# Patient Record
Sex: Male | Born: 1985 | Race: Black or African American | Hispanic: No | Marital: Married | Smoking: Never smoker
Health system: Southern US, Community
[De-identification: ages and names within clinical notes are randomized; demographics above are authoritative.]

## PROBLEM LIST (undated history)

## (undated) ENCOUNTER — Emergency Department: Payer: Self-pay

## (undated) DIAGNOSIS — R42 Dizziness and giddiness: Secondary | ICD-10-CM

## (undated) DIAGNOSIS — R001 Bradycardia, unspecified: Secondary | ICD-10-CM

## (undated) DIAGNOSIS — R7989 Other specified abnormal findings of blood chemistry: Secondary | ICD-10-CM

## (undated) DIAGNOSIS — R778 Other specified abnormalities of plasma proteins: Secondary | ICD-10-CM

## (undated) DIAGNOSIS — U071 COVID-19: Secondary | ICD-10-CM

## (undated) DIAGNOSIS — R9431 Abnormal electrocardiogram [ECG] [EKG]: Secondary | ICD-10-CM

## (undated) HISTORY — DX: Bradycardia, unspecified: R00.1

## (undated) HISTORY — DX: COVID-19: U07.1

## (undated) HISTORY — DX: Other specified abnormalities of plasma proteins: R77.8

## (undated) HISTORY — DX: Dizziness and giddiness: R42

## (undated) HISTORY — DX: Abnormal electrocardiogram (ECG) (EKG): R94.31

## (undated) HISTORY — DX: Other specified abnormal findings of blood chemistry: R79.89

---

## 2016-05-05 ENCOUNTER — Emergency Department (HOSPITAL_BASED_OUTPATIENT_CLINIC_OR_DEPARTMENT_OTHER)
Admission: EM | Admit: 2016-05-05 | Discharge: 2016-05-05 | Disposition: A | Discharge status: Home / Self Care | Payer: Self-pay | Attending: Emergency Medicine | Admitting: Emergency Medicine

## 2016-05-05 ENCOUNTER — Encounter (HOSPITAL_BASED_OUTPATIENT_CLINIC_OR_DEPARTMENT_OTHER): Payer: Self-pay | Admitting: Emergency Medicine

## 2016-05-05 DIAGNOSIS — R599 Enlarged lymph nodes, unspecified: Secondary | ICD-10-CM | POA: Insufficient documentation

## 2016-05-05 MED ORDER — IBUPROFEN 800 MG PO TABS: 800.0000 mg | ORAL_TABLET | Freq: Three times a day (TID) | ORAL | 0 refills | Status: DC | PRN

## 2016-05-05 NOTE — ED Notes (Addendum)
Has a chip wisdom tooth on rt side x several weeks,  States now is having temporal swelling and ha   Pt playing on cell phone

## 2016-05-05 NOTE — Discharge Instructions (Signed)
Return here as needed.  Use warm compresses over the areas.  Follow-up with the ENT, Dr. provided

## 2016-05-05 NOTE — ED Provider Notes (Signed)
[facility] Provider Note   CSN: [id number] Arrival date & time: 05/05/16  2010  By signing my name below, I, [name], attest that this documentation has been prepared under the direction and in the presence of BoeingChris Hriday Stai, VF CorporationPA-C. Electronically Signed: Angelene GiovanniEmmanuella Mensah, ED Scribe. 05/05/16. 9:07 PM.   History   Chief Complaint Chief Complaint  Patient presents with  . Headache    HPI Comments: Jason Moreno is a 30 y.o. male who presents to the Emergency Department complaining of gradually worsening moderate temporal headache onset one week ago. He explains that one month ago he had right lower dental pain that resolved on its own and now he is experiencing swelling to his bilateral temples. He adds that he has not been sleeping appropriately. He denies any recent falls, injuries, or trauma. No alleviating factors noted. Pt has not tried any medications PTA. He has NKDA. He denies any fever, chills, visual changes, nausea, vomiting, shortness of breath, generalized rash, or any other symptoms.   The history is provided by the patient. No language interpreter was used.    History reviewed. No pertinent past medical history.  There are no active problems to display for this patient.   History reviewed. No pertinent surgical history.     Home Medications    Prior to Admission medications   Medication Sig Start Date End Date Taking? Authorizing Provider  ibuprofen (ADVIL,MOTRIN) 600 MG tablet Take 600 mg by mouth every 6 (six) hours as needed.   Yes Historical Provider, MD    Family History History reviewed. No pertinent family history.  Social History Social History  Substance Use Topics  . Smoking status: Never Smoker  . Smokeless tobacco: Never Used  . Alcohol use Yes     Comment: occ     Allergies   Patient has no known allergies.   Review of Systems Review of Systems All other systems negative except as documented in the HPI. All  pertinent positives and negatives as reviewed in the HPI.  Physical Exam Updated Vital Signs BP 158/90 (BP Location: Left Arm)   Pulse (!) 50   Temp 98.7 F (37.1 C) (Oral)   Resp 18   Ht 5\' 8"  (1.727 m)   Wt 177 lb (80.3 kg)   SpO2 99%   BMI 26.91 kg/m   Physical Exam  Constitutional: He is oriented to person, place, and time. He appears well-developed and well-nourished.  HENT:  Head: Normocephalic and atraumatic.  Swelling to his temple region, right greater than left; no fluctuance   Pulmonary/Chest: Effort normal.  Neurological: He is alert and oriented to person, place, and time.  Skin: Skin is warm and dry.  Psychiatric: He has a normal mood and affect.  Nursing note and vitals reviewed.    ED Treatments / Results  DIAGNOSTIC STUDIES: Oxygen Saturation is 99% on RA, normal by my interpretation.    COORDINATION OF CARE: 9:03 PM- Pt advised of plan for treatment and pt agrees. Will consult attending for further plan for treatment.    Labs (all labs ordered are listed, but only abnormal results are displayed) Labs Reviewed - No data to display  EKG  EKG Interpretation None       Radiology No results found.  Procedures Procedures (including critical care time)  Medications Ordered in ED Medications - No data to display   Initial Impression / Assessment and Plan / ED Course  Ebbie Ridgehris Adriana Quinby, PA-C has reviewed the triage vital signs and the  nursing notes.  Pertinent labs & imaging results that were available during my care of the patient were reviewed by me and considered in my medical decision making (see chart for details).  Clinical Course     Patient be referred to ENT for further evaluation of the area.  This could be a lymph node involvement.  There is no signs of infection or abscess  Final Clinical Impressions(s) / ED Diagnoses   Final diagnoses:  None    New Prescriptions New Prescriptions   No medications on file   I personally  performed the services described in this documentation, which was scribed in my presence. The recorded information has been reviewed and is accurate.   Charlestine NightChristopher Vaibhav Fogleman, PA-C 05/07/16 0131    Maia PlanJoshua G Long, MD 05/07/16 [phone]

## 2016-05-05 NOTE — ED Triage Notes (Signed)
Patient c/o headache x1 week after having ongoing dental pain associated with his wisdom teeth. Patient states today he has noticed swelling at his temples.

## 2017-01-30 ENCOUNTER — Emergency Department: Payer: Self-pay

## 2017-01-30 ENCOUNTER — Emergency Department
Admission: EM | Admit: 2017-01-30 | Discharge: 2017-01-30 | Disposition: A | Discharge status: Home / Self Care | Payer: Self-pay | Attending: Emergency Medicine | Admitting: Emergency Medicine

## 2017-01-30 DIAGNOSIS — K047 Periapical abscess without sinus: Secondary | ICD-10-CM | POA: Insufficient documentation

## 2017-01-30 LAB — CBC WITH DIFFERENTIAL/PLATELET
BASOS ABS: 0.1 10*3/uL (ref 0–0.1)
BASOS PCT: 1 %
EOS PCT: 1 %
Eosinophils Absolute: 0.1 10*3/uL (ref 0–0.7)
HEMATOCRIT: 43.5 % (ref 40.0–52.0)
Hemoglobin: 14.8 g/dL (ref 13.0–18.0)
LYMPHS ABS: 1.7 10*3/uL (ref 1.0–3.6)
Lymphocytes Relative: 19 %
MCH: 32.2 pg (ref 26.0–34.0)
MCHC: 34 g/dL (ref 32.0–36.0)
MCV: 94.7 fL (ref 80.0–100.0)
Monocytes Absolute: 0.7 10*3/uL (ref 0.2–1.0)
Monocytes Relative: 8 %
Neutro Abs: 6.3 10*3/uL (ref 1.4–6.5)
Neutrophils Relative %: 71 %
PLATELETS: 223 10*3/uL (ref 150–440)
RBC: 4.59 MIL/uL (ref 4.40–5.90)
RDW: 13.7 % (ref 11.5–14.5)
WBC: 8.9 10*3/uL (ref 3.8–10.6)

## 2017-01-30 LAB — BASIC METABOLIC PANEL
Anion gap: 7 (ref 5–15)
BUN: 7 mg/dL (ref 6–20)
CHLORIDE: 103 mmol/L (ref 101–111)
CO2: 27 mmol/L (ref 22–32)
CREATININE: 1.01 mg/dL (ref 0.61–1.24)
Calcium: 9.7 mg/dL (ref 8.9–10.3)
Glucose, Bld: 105 mg/dL — ABNORMAL HIGH (ref 65–99)
POTASSIUM: 4.1 mmol/L (ref 3.5–5.1)
SODIUM: 137 mmol/L (ref 135–145)

## 2017-01-30 MED ORDER — IOPAMIDOL (ISOVUE-300) INJECTION 61%
75.0000 mL | Freq: Once | INTRAVENOUS | Status: AC | PRN
  Administered 2017-01-30: 75 mL via INTRAVENOUS

## 2017-01-30 MED ORDER — IBUPROFEN 600 MG PO TABS: 600.0000 mg | ORAL_TABLET | Freq: Four times a day (QID) | ORAL | 0 refills | Status: DC | PRN

## 2017-01-30 MED ORDER — CLINDAMYCIN HCL 300 MG PO CAPS: 300.0000 mg | ORAL_CAPSULE | Freq: Three times a day (TID) | ORAL | 0 refills | Status: AC

## 2017-01-30 MED ORDER — IBUPROFEN 600 MG PO TABS
600.0000 mg | ORAL_TABLET | Freq: Once | ORAL | Status: AC
  Administered 2017-01-30: 600 mg via ORAL
  Filled 2017-01-30: qty 1

## 2017-01-30 NOTE — ED Provider Notes (Signed)
[facility] Emergency Department Provider Note ____________________________________________   First MD Initiated Contact with Patient 01/30/17 1205     (approximate)  I have reviewed the triage vital signs and the nursing notes.   HISTORY  Chief Complaint Facial Pain    HPI Jason Moreno is a 31 y.o. male no significant past medical history who presents with right-sided facial swelling for 5 days gradual onset, worsening course, associated with pain especially when he walks.Patient denies trauma to the area. He states he was seen in urgent care 3 days ago and started on amoxicillin but the swelling has worsened. Patient denies neck pain or swelling, difficulty swallowing, or shortness of breath. He reports mild headache. No dental pain. No ear pain.  History reviewed. No pertinent past medical history.  There are no active problems to display for this patient.   History reviewed. No pertinent surgical history.  Prior to Admission medications   Medication Sig Start Date End Date Taking? Authorizing Provider  amoxicillin-clavulanate (AUGMENTIN) 875-125 MG tablet Take 1 tablet by mouth 2 (two) times daily.   Yes [provider]  ibuprofen (ADVIL,MOTRIN) 800 MG tablet Take 1 tablet (800 mg total) by mouth every 8 (eight) hours as needed. 05/05/16  Yes Lawyer, Cristal Deer, PA-C  clindamycin (CLEOCIN) 300 MG capsule Take 1 capsule (300 mg total) by mouth 3 (three) times daily. 01/30/17 02/09/17  Dionne Bucy, MD  ibuprofen (ADVIL,MOTRIN) 600 MG tablet Take 1 tablet (600 mg total) by mouth every 6 (six) hours as needed (pain). 01/30/17   Dionne Bucy, MD    Allergies Patient has no known allergies.  No family history on file.  Social History Social History  Substance Use Topics  . Smoking status: Never Smoker  . Smokeless tobacco: Never Used  . Alcohol use Yes     Comment: occ    Review of Systems  Constitutional: No  fever/chills Eyes: No visual changes. ENT: No sore throat. No difficulty swallowing. Cardiovascular: Denies chest pain. Respiratory: Denies shortness of breath. Gastrointestinal: No nausea, no vomiting.   Genitourinary: Negative for flank pain.  Musculoskeletal: Negative for back pain. Skin: Negative for rash. Neurological: Positive for headaches, negative for focal weakness or numbness.   ____________________________________________   PHYSICAL EXAM:  VITAL SIGNS: ED Triage Vitals  Enc Vitals Group     BP 01/30/17 1113 (!) 199/106     Pulse Rate 01/30/17 1113 (!) 59     Resp 01/30/17 1113 16     Temp 01/30/17 1113 98.8 F (37.1 C)     Temp Source 01/30/17 1113 Oral     SpO2 01/30/17 1113 99 %     Weight 01/30/17 1114 180 lb (81.6 kg)     Height --      Head Circumference --      Peak Flow --      Pain Score 01/30/17 1112 8     Pain Loc --      Pain Edu? --      Excl. in GC? --     Constitutional: Alert and oriented. Well appearing and in no acute distress. Eyes: Conjunctivae are normal.  Head: Atraumatic. Right maxillary area with swelling, with mild fluctuance. Nose: No congestion/rhinnorhea. Mouth/Throat: Mucous membranes are moist.  Oropharynx clear, no pooling secretions. No stridor. Neck: Normal range of motion. Right anterior cervical mild lymphadenopathy. Cardiovascular: Normal rate, regular rhythm. Good peripheral circulation. Respiratory: Normal respiratory effort.  No retractions. Gastrointestinal:  No distention.  Genitourinary: No CVA tenderness. Musculoskeletal:  Extremities warm and well perfused.  Neurologic:  Normal speech and language. No gross focal neurologic deficits are appreciated.  Skin:  Skin is warm and dry. No rash noted. Psychiatric: Mood and affect are normal. Speech and behavior are normal.  ____________________________________________   LABS (all labs ordered are listed, but only abnormal results are displayed)  Labs Reviewed    BASIC METABOLIC PANEL - Abnormal; Notable for the following:       Result Value   Glucose, Bld 105 (*)    All other components within normal limits  CBC WITH DIFFERENTIAL/PLATELET   ____________________________________________  EKG   ____________________________________________  RADIOLOGY    ____________________________________________   PROCEDURES  Procedure(s) performed: No    Critical Care performed: No ____________________________________________   INITIAL IMPRESSION / ASSESSMENT AND PLAN / ED COURSE  Pertinent labs & imaging results that were available during my care of the patient were reviewed by me and considered in my medical decision making (see chart for details).  31 year old malesignificant past medical history presents with approximate 5 days right-sided facial swelling with pain. Vital signs normal except for hypertension likely associated with the pain, exam otherwise as described with right-sided maxillary area swelling. Differential includes dental infection, dental abscess, other soft tissue infection of buccal area, less likely parotitis based on location. Plan for a sick labs and CT maxillofacial to evaluate for abscess and possible origin.    ----------------------------------------- 5:17 PM on 01/30/2017 -----------------------------------------  CT revealed evidence of periapical infection with some involvement of maxillary sinus. I consulted Dr. Mauri Pole from [facility] manifests at [facility] and read the CT results to him over the phone and described patient's clinical status. He recommended patient get close dental follow-up likely for root canal or extraction. Based on clinical status no indication for IV antibiotics, emergent procedure, or admission. Will change antibiotic to broader coverage with clindamycin. Patient feels well to go home, and I gave him extensive return precautions. Will return if worsening swelling, pain, fever, weakness or any difficulty  breathing or swallowing.  ____________________________________________   FINAL CLINICAL IMPRESSION(S) / ED DIAGNOSES  Final diagnoses:  Dental infection      NEW MEDICATIONS STARTED DURING THIS VISIT:  Discharge Medication List as of 01/30/2017  5:26 PM    START taking these medications   Details  clindamycin (CLEOCIN) 300 MG capsule Take 1 capsule (300 mg total) by mouth 3 (three) times daily., Starting Sat 01/30/2017, Until Tue 02/09/2017, Print    !! ibuprofen (ADVIL,MOTRIN) 600 MG tablet Take 1 tablet (600 mg total) by mouth every 6 (six) hours as needed (pain)., Starting Sat 01/30/2017, Print     !! - Potential duplicate medications found. Please discuss with provider.       Note:  This document was prepared using Dragon voice recognition software and may include unintentional dictation errors.    Dionne Bucy, MD 01/30/17 2136

## 2017-01-30 NOTE — Discharge Instructions (Signed)
Discontinue the antibiotic you're currently on and begin the clindamycin that is being prescribed.  Return to the ER for new or worsening pain worsening swelling, fevers, weakness, or any difficulty breathing or swallowing. Follow up with a dentist on Monday or Tuesday, either your own or with the resources provided here.

## 2017-01-30 NOTE — ED Triage Notes (Signed)
Pt arrives to ER via POV c/o right sided facial swelling and pain. Pt reports pain and swelling since Tuesday, was seen at an Urgent Care then and prescribed antibiotics and has been taking as prescribed. Pain and swelling worsening. Pt has clear airway, speech clear, controlling secretions.

## 2020-07-05 ENCOUNTER — Emergency Department (HOSPITAL_COMMUNITY): Payer: 59

## 2020-07-05 ENCOUNTER — Observation Stay (HOSPITAL_COMMUNITY)
Admission: EM | Admit: 2020-07-05 | Discharge: 2020-07-07 | Disposition: A | Discharge status: Home / Self Care | Payer: 59 | Attending: Internal Medicine | Admitting: Internal Medicine

## 2020-07-05 ENCOUNTER — Other Ambulatory Visit: Payer: Self-pay

## 2020-07-05 DIAGNOSIS — R9431 Abnormal electrocardiogram [ECG] [EKG]: Secondary | ICD-10-CM

## 2020-07-05 DIAGNOSIS — R7989 Other specified abnormal findings of blood chemistry: Secondary | ICD-10-CM

## 2020-07-05 DIAGNOSIS — R42 Dizziness and giddiness: Secondary | ICD-10-CM | POA: Diagnosis present

## 2020-07-05 DIAGNOSIS — U071 COVID-19: Secondary | ICD-10-CM

## 2020-07-05 DIAGNOSIS — R001 Bradycardia, unspecified: Secondary | ICD-10-CM | POA: Diagnosis present

## 2020-07-05 DIAGNOSIS — R778 Other specified abnormalities of plasma proteins: Secondary | ICD-10-CM

## 2020-07-05 DIAGNOSIS — Z7982 Long term (current) use of aspirin: Secondary | ICD-10-CM | POA: Insufficient documentation

## 2020-07-05 DIAGNOSIS — H5500 Unspecified nystagmus: Secondary | ICD-10-CM | POA: Diagnosis not present

## 2020-07-05 LAB — COMPREHENSIVE METABOLIC PANEL
ALT: 38 U/L (ref 0–44)
AST: 30 U/L (ref 15–41)
Albumin: 4.3 g/dL (ref 3.5–5.0)
Alkaline Phosphatase: 40 U/L (ref 38–126)
Anion gap: 13 (ref 5–15)
BUN: 9 mg/dL (ref 6–20)
CO2: 22 mmol/L (ref 22–32)
Calcium: 9.1 mg/dL (ref 8.9–10.3)
Chloride: 106 mmol/L (ref 98–111)
Creatinine, Ser: 1.11 mg/dL (ref 0.61–1.24)
GFR, Estimated: >60 mL/min (ref >60)
Glucose, Bld: 124 mg/dL — ABNORMAL HIGH (ref 70–99)
Potassium: 3.9 mmol/L (ref 3.5–5.1)
Sodium: 141 mmol/L (ref 135–145)
Total Bilirubin: 0.7 mg/dL (ref 0.3–1.2)
Total Protein: 7.5 g/dL (ref 6.5–8.1)

## 2020-07-05 LAB — CBC
HCT: 46.9 % (ref 39.0–52.0)
Hemoglobin: 15.4 g/dL (ref 13.0–17.0)
MCH: 31 pg (ref 26.0–34.0)
MCHC: 32.8 g/dL (ref 30.0–36.0)
MCV: 94.6 fL (ref 80.0–100.0)
Platelets: 211 10*3/uL (ref 150–400)
RBC: 4.96 MIL/uL (ref 4.22–5.81)
RDW: 13 % (ref 11.5–15.5)
WBC: 4.1 10*3/uL (ref 4.0–10.5)
nRBC: 0 % (ref 0.0–0.2)

## 2020-07-05 LAB — PROTIME-INR
INR: 1 (ref 0.8–1.2)
Prothrombin Time: 12.9 seconds (ref 11.4–15.2)

## 2020-07-05 LAB — DIFFERENTIAL
Abs Immature Granulocytes: 0.01 10*3/uL (ref 0.00–0.07)
Basophils Absolute: 0 10*3/uL (ref 0.0–0.1)
Basophils Relative: 0 %
Eosinophils Absolute: 0 10*3/uL (ref 0.0–0.5)
Eosinophils Relative: 0 %
Immature Granulocytes: 0 %
Lymphocytes Relative: 25 %
Lymphs Abs: 1.1 10*3/uL (ref 0.7–4.0)
Monocytes Absolute: 0.3 10*3/uL (ref 0.1–1.0)
Monocytes Relative: 6 %
Neutro Abs: 2.8 10*3/uL (ref 1.7–7.7)
Neutrophils Relative %: 69 %

## 2020-07-05 LAB — TROPONIN I (HIGH SENSITIVITY)
Troponin I (High Sensitivity): 37 ng/L — ABNORMAL HIGH (ref <18)
Troponin I (High Sensitivity): 35 ng/L — ABNORMAL HIGH (ref <18)

## 2020-07-05 LAB — APTT: aPTT: 28 seconds (ref 24–36)

## 2020-07-05 MED ORDER — IOHEXOL 350 MG/ML SOLN
75.0000 mL | Freq: Once | INTRAVENOUS | Status: AC | PRN
  Administered 2020-07-05: 75 mL via INTRAVENOUS

## 2020-07-05 MED ORDER — LORAZEPAM 2 MG/ML IJ SOLN
1.0000 mg | Freq: Once | INTRAMUSCULAR | Status: AC | PRN
  Administered 2020-07-05: 1 mg via INTRAVENOUS
  Filled 2020-07-05: qty 1

## 2020-07-05 MED ORDER — ONDANSETRON HCL 4 MG/2ML IJ SOLN
4.0000 mg | Freq: Once | INTRAMUSCULAR | Status: AC
  Administered 2020-07-05: 4 mg via INTRAVENOUS
  Filled 2020-07-05: qty 2

## 2020-07-05 NOTE — ED Notes (Signed)
Pt to CT

## 2020-07-05 NOTE — ED Provider Notes (Signed)
[facility] EMERGENCY DEPARTMENT Provider Note   CSN: [id number] Arrival date & time: 07/05/20  1608     History Chief Complaint  Patient presents with   Abnormal ECG    Geovany Trudo is a 35 y.o. male.  The history is provided by the patient, medical records and the EMS personnel.   Dathan Attia is a 35 y.o. male who presents to the Emergency Department complaining of dizziness and vomiting. He presents the emergency department by EMS from urgent care for evaluation of dizziness and vomiting that started yesterday. He states that symptoms began abruptly at noon when he suddenly felt dizzy and off balance when he tried to walk. He had associated nausea. Symptoms worsened at three when he went to pick up his daughter from daycare. He denies any fevers, headache, chest pain, shortness of breath, abdominal pain, diarrhea, numbness, weakness, vision changes. No prior similar symptoms. When he was at urgent care he had an EKG performed that showed ST elevation in his anterior leads with T-wave inversion in the inferior leads and he was referred to the emergency department for further evaluation.  He has no known medical problems and takes no medications. No recent sick contacts. He has not been vaccinated for COVID-19.    No past medical history on file.  There are no problems to display for this patient.   No past surgical history on file.     No family history on file.  Social History   Tobacco Use   Smoking status: Never Smoker   Smokeless tobacco: Never Used  Substance Use Topics   Alcohol use: Yes    Comment: occ   Drug use: Yes    Types: Marijuana    Comment: occ    Home Medications Prior to Admission medications   Medication Sig Start Date End Date Taking? Authorizing Provider  amoxicillin-clavulanate (AUGMENTIN) 875-125 MG tablet Take 1 tablet by mouth 2 (two) times daily.    [provider]  aspirin 81 MG chewable tablet Chew  by mouth. 07/05/20 07/05/20  [provider]  ibuprofen (ADVIL,MOTRIN) 600 MG tablet Take 1 tablet (600 mg total) by mouth every 6 (six) hours as needed (pain). 01/30/17   Dionne Bucy, MD  ibuprofen (ADVIL,MOTRIN) 800 MG tablet Take 1 tablet (800 mg total) by mouth every 8 (eight) hours as needed. 05/05/16   Charlestine Night, PA-C    Allergies    Patient has no known allergies.  Review of Systems   Review of Systems  All other systems reviewed and are negative.   Physical Exam Updated Vital Signs BP (!) 147/89    Pulse (!) 54    Temp 98.3 F (36.8 C) (Oral)    Resp 18    Ht 5\' 7"  (1.702 m)    Wt 74.8 kg    SpO2 98%    BMI 25.84 kg/m   Physical Exam Vitals and nursing note reviewed.  Constitutional:      Appearance: He is well-developed and well-nourished.  HENT:     Head: Normocephalic and atraumatic.  Eyes:     Extraocular Movements: Extraocular movements intact.     Pupils: Pupils are equal, round, and reactive to light.  Cardiovascular:     Rate and Rhythm: Regular rhythm. Bradycardia present.     Heart sounds: No murmur heard.   Pulmonary:     Effort: Pulmonary effort is normal. No respiratory distress.     Breath sounds: Normal breath sounds.  Abdominal:  Palpations: Abdomen is soft.     Tenderness: There is no abdominal tenderness. There is no guarding or rebound.  Musculoskeletal:        General: No tenderness or edema.  Skin:    General: Skin is warm and dry.  Neurological:     Mental Status: He is alert and oriented to person, place, and time.     Comments: No asymmetry of facial movements. Five out of five strength in all four extremities with sensational light touch intact in all four extremities. There is nystagmus on horizontal gaze to the right. There is mild ataxia on finger to nose with the left upper extremity.  Psychiatric:        Mood and Affect: Mood and affect normal.        Behavior: Behavior normal.     ED Results /  Procedures / Treatments   Labs (all labs ordered are listed, but only abnormal results are displayed) Labs Reviewed  COMPREHENSIVE METABOLIC PANEL - Abnormal; Notable for the following components:      Result Value   Glucose, Bld 124 (*)    All other components within normal limits  TROPONIN I (HIGH SENSITIVITY) - Abnormal; Notable for the following components:   Troponin I (High Sensitivity) 37 (*)    All other components within normal limits  TROPONIN I (HIGH SENSITIVITY) - Abnormal; Notable for the following components:   Troponin I (High Sensitivity) 35 (*)    All other components within normal limits  SARS CORONAVIRUS 2 (TAT 6-24 HRS)  PROTIME-INR  APTT  CBC  DIFFERENTIAL    EKG EKG Interpretation  Date/Time:  Friday July 05 2020 16:10:52 EST Ventricular Rate:  49 PR Interval:    QRS Duration: 99 QT Interval:  514 QTC Calculation: 464 R Axis:   27 Text Interpretation: Sinus bradycardia Consider left atrial enlargement Abnormal T, consider ischemia, diffuse leads Borderline ST elevation, anterior leads No previous tracing Confirmed by Tilden Fossa [phone]) on 07/05/2020 4:32:36 PM   Radiology CT Angio Head W or Wo Contrast  Result Date: 07/05/2020 CLINICAL DATA:  Initial evaluation for acute dizziness. EXAM: CT ANGIOGRAPHY HEAD AND NECK TECHNIQUE: Multidetector CT imaging of the head and neck was performed using the standard protocol during bolus administration of intravenous contrast. Multiplanar CT image reconstructions and MIPs were obtained to evaluate the vascular anatomy. Carotid stenosis measurements (when applicable) are obtained utilizing NASCET criteria, using the distal internal carotid diameter as the denominator. CONTRAST:  43mL OMNIPAQUE IOHEXOL 350 MG/ML SOLN COMPARISON:  Prior CT and MRI from earlier same day. FINDINGS: CTA NECK FINDINGS Aortic arch: Visualized aortic arch of normal caliber with normal 3 vessel morphology. No hemodynamically significant  stenosis about the origin of the great vessels. Right carotid system: Right common and internal carotid arteries widely patent without stenosis, dissection or occlusion. Left carotid system: Left common and internal carotid arteries widely patent without stenosis, dissection or occlusion. Vertebral arteries: Both vertebral arteries arise from the subclavian arteries. No proximal subclavian artery stenosis. Left vertebral artery slightly dominant. Vertebral arteries patent without stenosis, dissection or occlusion. Skeleton: No visible acute osseous abnormality. No discrete or worrisome osseous lesions. Other neck: No other acute soft tissue abnormality within the neck. No mass or adenopathy. Mild-to-moderate mucosal thickening noted within the paranasal sinuses, likely inflammatory/allergic in nature. Upper chest: Visualized upper chest demonstrates no acute finding. Review of the MIP images confirms the above findings CTA HEAD FINDINGS Anterior circulation: Both internal carotid arteries widely patent to the  termini without stenosis or other abnormality. A1 segments widely patent. Normal anterior communicating artery complex. Anterior cerebral arteries patent to their distal aspects without stenosis. No M1 stenosis or occlusion. Normal MCA bifurcations. Distal MCA branches well perfused and symmetric. Posterior circulation: Both V4 segments widely patent to the vertebrobasilar junction. Both PICA origins patent and normal. Basilar widely patent to its distal aspect. Superior cerebellar arteries patent bilaterally. Both PCA supplied via the basilar as well as small bilateral posterior communicating arteries. PCAs widely patent to their distal aspects. Venous sinuses: Patent. Anatomic variants: None significant.  No aneurysm. Review of the MIP images confirms the above findings IMPRESSION: Normal CTA of the head and neck. No large vessel occlusion, hemodynamically significant stenosis, or other acute vascular  abnormality. Electronically Signed   By: Rise Mu M.D.   On: 07/05/2020 23:14   CT HEAD WO CONTRAST  Result Date: 07/05/2020 CLINICAL DATA:  Vertigo. EXAM: CT HEAD WITHOUT CONTRAST TECHNIQUE: Contiguous axial images were obtained from the base of the skull through the vertex without intravenous contrast. COMPARISON:  None. FINDINGS: Brain: No acute infarct or intracranial hemorrhage. No mass lesion. No midline shift, ventriculomegaly or extra-axial fluid collection. Vascular: No hyperdense vessel or unexpected calcification. Skull: Negative for fracture or focal lesion. Sinuses/Orbits: No acute finding.  Mild pansinus mucosal thickening. Other: None. IMPRESSION: No acute intracranial process. Electronically Signed   By: Stana Bunting M.D.   On: 07/05/2020 18:01   CT Angio Neck W and/or Wo Contrast  Result Date: 07/05/2020 CLINICAL DATA:  Initial evaluation for acute dizziness. EXAM: CT ANGIOGRAPHY HEAD AND NECK TECHNIQUE: Multidetector CT imaging of the head and neck was performed using the standard protocol during bolus administration of intravenous contrast. Multiplanar CT image reconstructions and MIPs were obtained to evaluate the vascular anatomy. Carotid stenosis measurements (when applicable) are obtained utilizing NASCET criteria, using the distal internal carotid diameter as the denominator. CONTRAST:  69mL OMNIPAQUE IOHEXOL 350 MG/ML SOLN COMPARISON:  Prior CT and MRI from earlier same day. FINDINGS: CTA NECK FINDINGS Aortic arch: Visualized aortic arch of normal caliber with normal 3 vessel morphology. No hemodynamically significant stenosis about the origin of the great vessels. Right carotid system: Right common and internal carotid arteries widely patent without stenosis, dissection or occlusion. Left carotid system: Left common and internal carotid arteries widely patent without stenosis, dissection or occlusion. Vertebral arteries: Both vertebral arteries arise from the  subclavian arteries. No proximal subclavian artery stenosis. Left vertebral artery slightly dominant. Vertebral arteries patent without stenosis, dissection or occlusion. Skeleton: No visible acute osseous abnormality. No discrete or worrisome osseous lesions. Other neck: No other acute soft tissue abnormality within the neck. No mass or adenopathy. Mild-to-moderate mucosal thickening noted within the paranasal sinuses, likely inflammatory/allergic in nature. Upper chest: Visualized upper chest demonstrates no acute finding. Review of the MIP images confirms the above findings CTA HEAD FINDINGS Anterior circulation: Both internal carotid arteries widely patent to the termini without stenosis or other abnormality. A1 segments widely patent. Normal anterior communicating artery complex. Anterior cerebral arteries patent to their distal aspects without stenosis. No M1 stenosis or occlusion. Normal MCA bifurcations. Distal MCA branches well perfused and symmetric. Posterior circulation: Both V4 segments widely patent to the vertebrobasilar junction. Both PICA origins patent and normal. Basilar widely patent to its distal aspect. Superior cerebellar arteries patent bilaterally. Both PCA supplied via the basilar as well as small bilateral posterior communicating arteries. PCAs widely patent to their distal aspects. Venous sinuses: Patent. Anatomic variants: None  significant.  No aneurysm. Review of the MIP images confirms the above findings IMPRESSION: Normal CTA of the head and neck. No large vessel occlusion, hemodynamically significant stenosis, or other acute vascular abnormality. Electronically Signed   By: Rise MuBenjamin  McClintock M.D.   On: 07/05/2020 23:14   MR BRAIN WO CONTRAST  Result Date: 07/05/2020 CLINICAL DATA:  Initial evaluation for acute dizziness. EXAM: MRI HEAD WITHOUT CONTRAST TECHNIQUE: Multiplanar, multiecho pulse sequences of the brain and surrounding structures were obtained without intravenous  contrast. COMPARISON:  Prior CT from earlier the same day. FINDINGS: Brain: Cerebral volume within normal limits. No focal parenchymal signal abnormality. No abnormal foci of restricted diffusion to suggest acute or subacute ischemia. Gray-white matter differentiation well maintained. No encephalomalacia to suggest chronic cortical infarction. No foci of susceptibility artifact to suggest acute or chronic intracranial hemorrhage. No mass lesion, midline shift or mass effect. Ventricles normal size without hydrocephalus. No extra-axial fluid collection. Pituitary gland suprasellar region normal. Midline structures intact and normal. Vascular: Major intracranial vascular flow voids are well maintained and normal. Skull and upper cervical spine: Craniocervical junction within normal limits. Bone marrow signal intensity normal. No scalp soft tissue abnormality. Sinuses/Orbits: Globes and orbital soft tissues within normal limits. Mild to moderate scattered mucosal thickening noted within the sphenoethmoidal and maxillary sinuses. Few superimposed retention cyst noted. No mastoid effusion. Inner ear structures grossly normal. Other: None. IMPRESSION: Normal brain MRI. No acute intracranial abnormality identified. Electronically Signed   By: Rise MuBenjamin  McClintock M.D.   On: 07/05/2020 21:50    Procedures Procedures (including critical care time)  Medications Ordered in ED Medications  ondansetron (ZOFRAN) injection 4 mg (has no administration in time range)  LORazepam (ATIVAN) injection 1 mg (1 mg Intravenous Given 07/05/20 2011)  iohexol (OMNIPAQUE) 350 MG/ML injection 75 mL (75 mLs Intravenous Contrast Given 07/05/20 2231)    ED Course  I have reviewed the triage vital signs and the nursing notes.  Pertinent labs & imaging results that were available during my care of the patient were reviewed by me and considered in my medical decision making (see chart for details).    MDM Rules/Calculators/A&P                          patient presents to the emergency department from urgent care for abnormal EKG. Patient reports nausea, vertiginous symptoms. He does have nystagmus on examination with mild ataxia on finger to nose in the left upper extremity. He has no active chest pain. EKG does have borderline ST elevation with ST depression in the inferior leads. Current presentation is not consistent with ACS. Initial troponin is mildly elevated. Initial concern for possible CVA. CT head is negative for acute intracranial abnormality, discussed with neurologist, recommends MRI followed by CTA head and neck. MRI and CTA head and neck are negative for acute ischemic issue. Repeat troponin is stable in comparison to prior. Discuss with on-call cardiologist, recommends observation admission to medicine service for echo. Medicine consulted for admission.  Final Clinical Impression(s) / ED Diagnoses Final diagnoses:  Abnormal EKG  Vertigo    Rx / DC Orders ED Discharge Orders    None       Tilden Fossaees, Sergey Ishler, MD 07/05/20 2341

## 2020-07-05 NOTE — ED Notes (Signed)
Pt to MRI via stretcher.

## 2020-07-05 NOTE — ED Triage Notes (Signed)
Pt arrives via [facility] from [facility] c/o N/V/dizziness times two days. UC reports T wave abnormalities. No code STEMI called in the field. Pt given 4 mg IV zofran and 325 mg ASA PTA.   EMS last VS 180/76, HR 48, RR 16 100% on RA, CBG 147  #18 L AC

## 2020-07-06 ENCOUNTER — Observation Stay (HOSPITAL_COMMUNITY): Payer: 59

## 2020-07-06 ENCOUNTER — Encounter (HOSPITAL_COMMUNITY): Payer: Self-pay | Admitting: Internal Medicine

## 2020-07-06 ENCOUNTER — Observation Stay (HOSPITAL_BASED_OUTPATIENT_CLINIC_OR_DEPARTMENT_OTHER): Payer: 59

## 2020-07-06 DIAGNOSIS — R9431 Abnormal electrocardiogram [ECG] [EKG]: Secondary | ICD-10-CM

## 2020-07-06 DIAGNOSIS — R42 Dizziness and giddiness: Secondary | ICD-10-CM | POA: Diagnosis not present

## 2020-07-06 DIAGNOSIS — R001 Bradycardia, unspecified: Secondary | ICD-10-CM | POA: Diagnosis present

## 2020-07-06 DIAGNOSIS — U071 COVID-19: Secondary | ICD-10-CM

## 2020-07-06 DIAGNOSIS — R7989 Other specified abnormal findings of blood chemistry: Secondary | ICD-10-CM

## 2020-07-06 DIAGNOSIS — R778 Other specified abnormalities of plasma proteins: Secondary | ICD-10-CM

## 2020-07-06 LAB — COMPREHENSIVE METABOLIC PANEL
ALT: 37 U/L (ref 0–44)
AST: 30 U/L (ref 15–41)
Albumin: 4.1 g/dL (ref 3.5–5.0)
Alkaline Phosphatase: 42 U/L (ref 38–126)
Anion gap: 9 (ref 5–15)
BUN: 11 mg/dL (ref 6–20)
CO2: 22 mmol/L (ref 22–32)
Calcium: 9.2 mg/dL (ref 8.9–10.3)
Chloride: 108 mmol/L (ref 98–111)
Creatinine, Ser: 1.2 mg/dL (ref 0.61–1.24)
GFR, Estimated: >60 mL/min (ref >60)
Glucose, Bld: 109 mg/dL — ABNORMAL HIGH (ref 70–99)
Potassium: 4 mmol/L (ref 3.5–5.1)
Sodium: 139 mmol/L (ref 135–145)
Total Bilirubin: 0.6 mg/dL (ref 0.3–1.2)
Total Protein: 7.4 g/dL (ref 6.5–8.1)

## 2020-07-06 LAB — ECHOCARDIOGRAM COMPLETE
Area-P 1/2: 3.06 cm2
Height: 67 in
S' Lateral: 3.2 cm
Weight: 2640 oz

## 2020-07-06 LAB — TROPONIN I (HIGH SENSITIVITY)
Troponin I (High Sensitivity): 32 ng/L — ABNORMAL HIGH (ref <18)
Troponin I (High Sensitivity): 32 ng/L — ABNORMAL HIGH (ref <18)
Troponin I (High Sensitivity): 32 ng/L — ABNORMAL HIGH (ref <18)
Troponin I (High Sensitivity): 30 ng/L — ABNORMAL HIGH (ref <18)

## 2020-07-06 LAB — CBC WITH DIFFERENTIAL/PLATELET
Abs Immature Granulocytes: 0 10*3/uL (ref 0.00–0.07)
Basophils Absolute: 0 10*3/uL (ref 0.0–0.1)
Basophils Relative: 0 %
Eosinophils Absolute: 0 10*3/uL (ref 0.0–0.5)
Eosinophils Relative: 1 %
HCT: 45.2 % (ref 39.0–52.0)
Hemoglobin: 14.7 g/dL (ref 13.0–17.0)
Lymphocytes Relative: 47 %
Lymphs Abs: 1.8 10*3/uL (ref 0.7–4.0)
MCH: 30.5 pg (ref 26.0–34.0)
MCHC: 32.5 g/dL (ref 30.0–36.0)
MCV: 93.8 fL (ref 80.0–100.0)
Monocytes Absolute: 0.3 10*3/uL (ref 0.1–1.0)
Monocytes Relative: 9 %
Neutro Abs: 1.6 10*3/uL — ABNORMAL LOW (ref 1.7–7.7)
Neutrophils Relative %: 43 %
Platelets: 226 10*3/uL (ref 150–400)
RBC: 4.82 MIL/uL (ref 4.22–5.81)
RDW: 13.1 % (ref 11.5–15.5)
WBC: 3.8 10*3/uL — ABNORMAL LOW (ref 4.0–10.5)
nRBC: 0 % (ref 0.0–0.2)
nRBC: 1 /100 WBC — ABNORMAL HIGH

## 2020-07-06 LAB — FIBRINOGEN: Fibrinogen: 335 mg/dL (ref 210–475)

## 2020-07-06 LAB — HIV ANTIBODY (ROUTINE TESTING W REFLEX): HIV Screen 4th Generation wRfx: NONREACTIVE

## 2020-07-06 LAB — C-REACTIVE PROTEIN: CRP: <0.5 mg/dL (ref <1.0)

## 2020-07-06 LAB — LIPID PANEL
Cholesterol: 197 mg/dL (ref 0–200)
HDL: 62 mg/dL (ref >40)
LDL Cholesterol: 110 mg/dL — ABNORMAL HIGH (ref 0–99)
Total CHOL/HDL Ratio: 3.2 RATIO
Triglycerides: 127 mg/dL (ref <150)
VLDL: 25 mg/dL (ref 0–40)

## 2020-07-06 LAB — TSH: TSH: 0.944 u[IU]/mL (ref 0.350–4.500)

## 2020-07-06 LAB — FERRITIN: Ferritin: 114 ng/mL (ref 24–336)

## 2020-07-06 LAB — PHOSPHORUS: Phosphorus: 3.3 mg/dL (ref 2.5–4.6)

## 2020-07-06 LAB — MAGNESIUM: Magnesium: 2.3 mg/dL (ref 1.7–2.4)

## 2020-07-06 LAB — SARS CORONAVIRUS 2 (TAT 6-24 HRS): SARS Coronavirus 2: POSITIVE — AB

## 2020-07-06 LAB — D-DIMER, QUANTITATIVE: D-Dimer, Quant: 0.34 ug/mL-FEU (ref 0.00–0.50)

## 2020-07-06 LAB — LACTATE DEHYDROGENASE: LDH: 152 U/L (ref 98–192)

## 2020-07-06 MED ORDER — MECLIZINE HCL 25 MG PO TABS: 25.0000 mg | ORAL_TABLET | Freq: Three times a day (TID) | ORAL | Status: DC

## 2020-07-06 MED ORDER — HEPARIN SODIUM (PORCINE) 5000 UNIT/ML IJ SOLN
5000.0000 [IU] | Freq: Three times a day (TID) | INTRAMUSCULAR | Status: DC
  Administered 2020-07-06 – 2020-07-07 (×5): 5000 [IU] via SUBCUTANEOUS
  Filled 2020-07-06 (×5): qty 1

## 2020-07-06 MED ORDER — MECLIZINE HCL 25 MG PO TABS: 25.0000 mg | ORAL_TABLET | Freq: Three times a day (TID) | ORAL | Status: DC | PRN

## 2020-07-06 MED ORDER — HYDROXYZINE HCL 10 MG PO TABS
10.0000 mg | ORAL_TABLET | Freq: Three times a day (TID) | ORAL | Status: DC
  Administered 2020-07-06 – 2020-07-07 (×3): 10 mg via ORAL
  Filled 2020-07-06 (×5): qty 1

## 2020-07-06 MED ORDER — VITAMIN D 25 MCG (1000 UNIT) PO TABS
1000.0000 [IU] | ORAL_TABLET | Freq: Every day | ORAL | Status: DC
  Administered 2020-07-06 – 2020-07-07 (×2): 1000 [IU] via ORAL
  Filled 2020-07-06 (×2): qty 1

## 2020-07-06 MED ORDER — ASCORBIC ACID 500 MG PO TABS
500.0000 mg | ORAL_TABLET | Freq: Every day | ORAL | Status: DC
  Administered 2020-07-06 – 2020-07-07 (×2): 500 mg via ORAL
  Filled 2020-07-06 (×2): qty 1

## 2020-07-06 MED ORDER — ASPIRIN EC 81 MG PO TBEC
81.0000 mg | DELAYED_RELEASE_TABLET | Freq: Every day | ORAL | Status: DC
  Administered 2020-07-06 – 2020-07-07 (×2): 81 mg via ORAL
  Filled 2020-07-06 (×2): qty 1

## 2020-07-06 MED ORDER — ROSUVASTATIN CALCIUM 5 MG PO TABS
10.0000 mg | ORAL_TABLET | Freq: Every day | ORAL | Status: DC
  Administered 2020-07-06 – 2020-07-07 (×2): 10 mg via ORAL
  Filled 2020-07-06 (×2): qty 2

## 2020-07-06 MED ORDER — MECLIZINE HCL 25 MG PO TABS
25.0000 mg | ORAL_TABLET | Freq: Three times a day (TID) | ORAL | Status: DC
  Administered 2020-07-06 – 2020-07-07 (×3): 25 mg via ORAL
  Filled 2020-07-06 (×3): qty 1

## 2020-07-06 MED ORDER — ONDANSETRON HCL 4 MG/2ML IJ SOLN
4.0000 mg | Freq: Four times a day (QID) | INTRAMUSCULAR | Status: DC | PRN
  Administered 2020-07-06: 4 mg via INTRAVENOUS
  Filled 2020-07-06: qty 2

## 2020-07-06 MED ORDER — ZINC SULFATE 220 (50 ZN) MG PO CAPS
220.0000 mg | ORAL_CAPSULE | Freq: Every day | ORAL | Status: DC
  Administered 2020-07-06 – 2020-07-07 (×2): 220 mg via ORAL
  Filled 2020-07-06 (×2): qty 1

## 2020-07-06 MED ORDER — ACETAMINOPHEN 325 MG PO TABS: 650.0000 mg | ORAL_TABLET | ORAL | Status: DC | PRN

## 2020-07-06 NOTE — Progress Notes (Signed)
  Echocardiogram 2D Echocardiogram has been performed.  Delcie Roch 07/06/2020, 2:43 PM

## 2020-07-06 NOTE — ED Notes (Signed)
Patient provided with bagged meal and cranberry juice. Bed repositioned. Patient feeding self without issue at this time. Denies further needs.

## 2020-07-06 NOTE — H&P (Signed)
TRH H&P    Patient Demographics:    Jason Moreno, is a 35 y.o. male  MRN: [id number]  DOB - 01/30/1986  Admit Date - 07/05/2020  Referring MD/NP/PA: Blinda LeatherwoodPollina  Outpatient Primary MD for the patient is Pcp, No  Patient coming from: Urgent care  Chief complaint- Dizziness   HPI:    Jason GripDenis Tippins  is a 35 y.o. male,with no known medical history presents to the ED with dizziness.  Patient reports that 2 days ago he started having episodes of nausea and dizziness.  They occurred mostly when standing.  He reports that he thought it was peripheral vertigo initially because he had been sleeping on hard pillows at work.  The symptoms continue to worsen.  He reports that he was avoiding eating because he was nauseous and had had episodes of vomiting.  When these spells happen he is not having any change in hearing, no tinnitus, no change in vision.  He does report that he has noticed nystagmus, with the TV looking shaky when he tries to watch it.  He has never had anything like this before.  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  He used to run every day.  He has been under a lot of stress at work, so he has not had time to run.  He reports that he used to meditate as well and has not been able to do this.  He feels very anxious at home, even just talking about his work.  He reports that his wife is a therapist and has recommended that he be on an anxiety medication.  He is open to this as well.  Patient does not smoke.  He drinks 2 to 3 glasses of wine nightly.  Patient does not use illicit drugs. Patient is not vaccinated for COVID.  Patient is full code.  In the ED T 98.3, HR 51, R13 - 25, BP 141/91 Trop 35, 37 Covid pending CT angio head and neck, CT head w/o contrast and MRI brain with out acute abnormalities  WBC 4.1, Hgb 15.4 Chem panel is unremarkable EKG SB, HR 49, QTc  464 Lorazepam and zofran    Review of systems:    In addition to the HPI above,  No Fever-chills, No Headache, No changes with Vision or hearing, No problems swallowing food or Liquids, No Chest pain, Cough or Shortness of Breath, No Abdominal pain, Admits to nausea and vomiting No Blood in stool or Urine, No dysuria, No new skin rashes or bruises, No new joints pains-aches,  No new weakness, tingling, numbness in any extremity, No recent weight gain or loss, No polyuria, polydypsia or polyphagia, Admits to significant mental stressors and feelings of anxiety  All other systems reviewed and are negative.    Past History of the following :    No past medical history on file.    No past surgical history on file.    Social History:      Social History   Tobacco Use  . Smoking status: Never  Smoker  . Smokeless tobacco: Never Used  Substance Use Topics  . Alcohol use: Yes    Comment: occ       Family History :    No family history on file. No known fam hx   Home Medications:   Prior to Admission medications   Medication Sig Start Date End Date Taking? Authorizing Provider  ibuprofen (ADVIL) 200 MG tablet Take 200 mg by mouth every 6 (six) hours as needed for headache or mild pain.   Yes [provider]     Allergies:    No Known Allergies   Physical Exam:   Vitals  Blood pressure (!) 141/91, pulse (!) 51, temperature 98.3 F (36.8 C), temperature source Oral, resp. rate 16, height 5\' 7"  (1.702 m), weight 74.8 kg, SpO2 99 %.  1.  General: Resting supine in bed in no acute distress  2. Psychiatric: Mood and behavior normal for situation, pleasant, cooperative with exam  3. Neurologic: CN II -XII grossly intact, no nystagmus, moves all 4 extremities voluntarily, speech and language normal, nystagmus present  4. HEENMT:  Head is atraumatic, normocephalic, pupils reactive, neck supple, trachea midline, mucous membranes moist  5.  Respiratory : LCTABL without wheeze, rhonchi, crackles, no cyanosis  6. Cardiovascular : Heart rate is bradycardic, no murmurs, rubs, gallops, no peripheral edema  7. Gastrointestinal:  Abdomen is soft, non distended, non tender to palpation without palpable mass  8. Skin:  Skin is warm, dry, and intact, no acute lesions on limited skin exam  9.Musculoskeletal:  No peripheral edema, no calf tenderness, peripheral pulses palpated    Data Review:    CBC Recent Labs  Lab 07/05/20 1617  WBC 4.1  HGB 15.4  HCT 46.9  PLT 211  MCV 94.6  MCH 31.0  MCHC 32.8  RDW 13.0  LYMPHSABS 1.1  MONOABS 0.3  EOSABS 0.0  BASOSABS 0.0   ------------------------------------------------------------------------------------------------------------------  Results for orders placed or performed during the hospital encounter of 07/05/20 (from the past 48 hour(s))  Protime-INR     Status: None   Collection Time: 07/05/20  4:17 PM  Result Value Ref Range   Prothrombin Time 12.9 11.4 - 15.2 seconds   INR 1.0 0.8 - 1.2    Comment: (NOTE) INR goal varies based on device and disease states. Performed at [facility], [address] [place], [place]   APTT     Status: None   Collection Time: 07/05/20  4:17 PM  Result Value Ref Range   aPTT 28 24 - 36 seconds    Comment: Performed at [facility], [address] [place], [place]  CBC     Status: None   Collection Time: 07/05/20  4:17 PM  Result Value Ref Range   WBC 4.1 4.0 - 10.5 K/uL   RBC 4.96 4.22 - 5.81 MIL/uL   Hemoglobin 15.4 13.0 - 17.0 g/dL   HCT 07/07/20 01.0 - 27.2 %   MCV 94.6 80.0 - 100.0 fL   MCH 31.0 26.0 - 34.0 pg   MCHC 32.8 30.0 - 36.0 g/dL   RDW 53.6 64.4 - 03.4 %   Platelets 211 150 - 400 K/uL   nRBC 0.0 0.0 - 0.2 %    Comment: Performed at [facility], [address] [place], [place]  Differential     Status: None   Collection Time: 07/05/20  4:17 PM  Result Value Ref  Range   Neutrophils  Relative % 69 %   Neutro Abs 2.8 1.7 - 7.7 K/uL   Lymphocytes Relative 25 %   Lymphs Abs 1.1 0.7 - 4.0 K/uL   Monocytes Relative 6 %   Monocytes Absolute 0.3 0.1 - 1.0 K/uL   Eosinophils Relative 0 %   Eosinophils Absolute 0.0 0.0 - 0.5 K/uL   Basophils Relative 0 %   Basophils Absolute 0.0 0.0 - 0.1 K/uL   Immature Granulocytes 0 %   Abs Immature Granulocytes 0.01 0.00 - 0.07 K/uL    Comment: Performed at [facility], [address], [place], [place] [address]  Comprehensive metabolic panel     Status: Abnormal   Collection Time: 07/05/20  4:17 PM  Result Value Ref Range   Sodium 141 135 - 145 mmol/L   Potassium 3.9 3.5 - 5.1 mmol/L   Chloride 106 98 - 111 mmol/L   CO2 22 22 - 32 mmol/L   Glucose, Bld 124 (H) 70 - 99 mg/dL    Comment: Glucose reference range applies only to samples taken after fasting for at least 8 hours.   BUN 9 6 - 20 mg/dL   Creatinine, Ser 1.91 0.61 - 1.24 mg/dL   Calcium 9.1 8.9 - 47.8 mg/dL   Total Protein 7.5 6.5 - 8.1 g/dL   Albumin 4.3 3.5 - 5.0 g/dL   AST 30 15 - 41 U/L   ALT 38 0 - 44 U/L   Alkaline Phosphatase 40 38 - 126 U/L   Total Bilirubin 0.7 0.3 - 1.2 mg/dL   GFR, Estimated >29 >56 mL/min    Comment: (NOTE) Calculated using the CKD-EPI Creatinine Equation (2021)    Anion gap 13 5 - 15    Comment: Performed at [facility], [address], [place], [place] [address]  Troponin I (High Sensitivity)     Status: Abnormal   Collection Time: 07/05/20  4:17 PM  Result Value Ref Range   Troponin I (High Sensitivity) 37 (H) <18 ng/L    Comment: (NOTE) Elevated high sensitivity troponin I (hsTnI) values and significant  changes across serial measurements may suggest ACS but many other  chronic and acute conditions are known to elevate hsTnI results.  Refer to the "Links" section for chest pain algorithms and additional  guidance. Performed at [facility], [address] [place], [place] [address]    Troponin I (High Sensitivity)     Status: Abnormal   Collection Time: 07/05/20  6:50 PM  Result Value Ref Range   Troponin I (High Sensitivity) 35 (H) <18 ng/L    Comment: (NOTE) Elevated high sensitivity troponin I (hsTnI) values and significant  changes across serial measurements may suggest ACS but many other  chronic and acute conditions are known to elevate hsTnI results.  Refer to the "Links" section for chest pain algorithms and additional  guidance. Performed at [facility], [address], [place], [place] [address]     Chemistries  Recent Labs  Lab 07/05/20 1617  NA 141  K 3.9  CL 106  CO2 22  GLUCOSE 124*  BUN 9  CREATININE 1.11  CALCIUM 9.1  AST 30  ALT 38  ALKPHOS 40  BILITOT 0.7   ------------------------------------------------------------------------------------------------------------------  ------------------------------------------------------------------------------------------------------------------ GFR: Estimated Creatinine Clearance: 86 mL/min (by C-G formula based on SCr of 1.11 mg/dL). Liver Function Tests: Recent Labs  Lab 07/05/20 1617  AST 30  ALT 38  ALKPHOS 40  BILITOT 0.7  PROT 7.5  ALBUMIN 4.3   No results for input(s): LIPASE, AMYLASE in the last 168 hours. No results for input(s): AMMONIA in the last 168 hours. Coagulation Profile: Recent Labs  Lab 07/05/20 1617  INR 1.0   Cardiac Enzymes: No results for input(s): CKTOTAL, CKMB, CKMBINDEX, TROPONINI in the last 168 hours. BNP (last 3 results) No results for input(s): PROBNP in the last 8760 hours. HbA1C: No results for input(s): HGBA1C in the last 72 hours. CBG: No results for input(s): GLUCAP in the last 168 hours. Lipid Profile: No results for input(s): CHOL, HDL, LDLCALC, TRIG, CHOLHDL, LDLDIRECT in the last 72 hours. Thyroid Function Tests: No results for input(s): TSH, T4TOTAL, FREET4, T3FREE, THYROIDAB in the last 72 hours. Anemia Panel: No results  for input(s): VITAMINB12, FOLATE, FERRITIN, TIBC, IRON, RETICCTPCT in the last 72 hours.  --------------------------------------------------------------------------------------------------------------- Urine analysis: No results found for: COLORURINE, APPEARANCEUR, LABSPEC, PHURINE, GLUCOSEU, HGBUR, BILIRUBINUR, KETONESUR, PROTEINUR, UROBILINOGEN, NITRITE, LEUKOCYTESUR    Imaging Results:    CT Angio Head W or Wo Contrast  Result Date: 07/05/2020 CLINICAL DATA:  Initial evaluation for acute dizziness. EXAM: CT ANGIOGRAPHY HEAD AND NECK TECHNIQUE: Multidetector CT imaging of the head and neck was performed using the standard protocol during bolus administration of intravenous contrast. Multiplanar CT image reconstructions and MIPs were obtained to evaluate the vascular anatomy. Carotid stenosis measurements (when applicable) are obtained utilizing NASCET criteria, using the distal internal carotid diameter as the denominator. CONTRAST:  75mL OMNIPAQUE IOHEXOL 350 MG/ML SOLN COMPARISON:  Prior CT and MRI from earlier same day. FINDINGS: CTA NECK FINDINGS Aortic arch: Visualized aortic arch of normal caliber with normal 3 vessel morphology. No hemodynamically significant stenosis about the origin of the great vessels. Right carotid system: Right common and internal carotid arteries widely patent without stenosis, dissection or occlusion. Left carotid system: Left common and internal carotid arteries widely patent without stenosis, dissection or occlusion. Vertebral arteries: Both vertebral arteries arise from the subclavian arteries. No proximal subclavian artery stenosis. Left vertebral artery slightly dominant. Vertebral arteries patent without stenosis, dissection or occlusion. Skeleton: No visible acute osseous abnormality. No discrete or worrisome osseous lesions. Other neck: No other acute soft tissue abnormality within the neck. No mass or adenopathy. Mild-to-moderate mucosal thickening noted within  the paranasal sinuses, likely inflammatory/allergic in nature. Upper chest: Visualized upper chest demonstrates no acute finding. Review of the MIP images confirms the above findings CTA HEAD FINDINGS Anterior circulation: Both internal carotid arteries widely patent to the termini without stenosis or other abnormality. A1 segments widely patent. Normal anterior communicating artery complex. Anterior cerebral arteries patent to their distal aspects without stenosis. No M1 stenosis or occlusion. Normal MCA bifurcations. Distal MCA branches well perfused and symmetric. Posterior circulation: Both V4 segments widely patent to the vertebrobasilar junction. Both PICA origins patent and normal. Basilar widely patent to its distal aspect. Superior cerebellar arteries patent bilaterally. Both PCA supplied via the basilar as well as small bilateral posterior communicating arteries. PCAs widely patent to their distal aspects. Venous sinuses: Patent. Anatomic variants: None significant.  No aneurysm. Review of the MIP images confirms the above findings IMPRESSION: Normal CTA of the head and neck. No large vessel occlusion, hemodynamically significant stenosis, or other acute vascular abnormality. Electronically Signed   By: Rise MuBenjamin  McClintock M.D.   On: 07/05/2020 23:14   CT HEAD WO CONTRAST  Result Date: 07/05/2020 CLINICAL DATA:  Vertigo. EXAM: CT HEAD WITHOUT CONTRAST TECHNIQUE: Contiguous axial images were obtained from the base of the skull through the  vertex without intravenous contrast. COMPARISON:  None. FINDINGS: Brain: No acute infarct or intracranial hemorrhage. No mass lesion. No midline shift, ventriculomegaly or extra-axial fluid collection. Vascular: No hyperdense vessel or unexpected calcification. Skull: Negative for fracture or focal lesion. Sinuses/Orbits: No acute finding.  Mild pansinus mucosal thickening. Other: None. IMPRESSION: No acute intracranial process. Electronically Signed   By: Stana Bunting M.D.   On: 07/05/2020 18:01   CT Angio Neck W and/or Wo Contrast  Result Date: 07/05/2020 CLINICAL DATA:  Initial evaluation for acute dizziness. EXAM: CT ANGIOGRAPHY HEAD AND NECK TECHNIQUE: Multidetector CT imaging of the head and neck was performed using the standard protocol during bolus administration of intravenous contrast. Multiplanar CT image reconstructions and MIPs were obtained to evaluate the vascular anatomy. Carotid stenosis measurements (when applicable) are obtained utilizing NASCET criteria, using the distal internal carotid diameter as the denominator. CONTRAST:  78mL OMNIPAQUE IOHEXOL 350 MG/ML SOLN COMPARISON:  Prior CT and MRI from earlier same day. FINDINGS: CTA NECK FINDINGS Aortic arch: Visualized aortic arch of normal caliber with normal 3 vessel morphology. No hemodynamically significant stenosis about the origin of the great vessels. Right carotid system: Right common and internal carotid arteries widely patent without stenosis, dissection or occlusion. Left carotid system: Left common and internal carotid arteries widely patent without stenosis, dissection or occlusion. Vertebral arteries: Both vertebral arteries arise from the subclavian arteries. No proximal subclavian artery stenosis. Left vertebral artery slightly dominant. Vertebral arteries patent without stenosis, dissection or occlusion. Skeleton: No visible acute osseous abnormality. No discrete or worrisome osseous lesions. Other neck: No other acute soft tissue abnormality within the neck. No mass or adenopathy. Mild-to-moderate mucosal thickening noted within the paranasal sinuses, likely inflammatory/allergic in nature. Upper chest: Visualized upper chest demonstrates no acute finding. Review of the MIP images confirms the above findings CTA HEAD FINDINGS Anterior circulation: Both internal carotid arteries widely patent to the termini without stenosis or other abnormality. A1 segments widely patent. Normal  anterior communicating artery complex. Anterior cerebral arteries patent to their distal aspects without stenosis. No M1 stenosis or occlusion. Normal MCA bifurcations. Distal MCA branches well perfused and symmetric. Posterior circulation: Both V4 segments widely patent to the vertebrobasilar junction. Both PICA origins patent and normal. Basilar widely patent to its distal aspect. Superior cerebellar arteries patent bilaterally. Both PCA supplied via the basilar as well as small bilateral posterior communicating arteries. PCAs widely patent to their distal aspects. Venous sinuses: Patent. Anatomic variants: None significant.  No aneurysm. Review of the MIP images confirms the above findings IMPRESSION: Normal CTA of the head and neck. No large vessel occlusion, hemodynamically significant stenosis, or other acute vascular abnormality. Electronically Signed   By: Rise Mu M.D.   On: 07/05/2020 23:14   MR BRAIN WO CONTRAST  Result Date: 07/05/2020 CLINICAL DATA:  Initial evaluation for acute dizziness. EXAM: MRI HEAD WITHOUT CONTRAST TECHNIQUE: Multiplanar, multiecho pulse sequences of the brain and surrounding structures were obtained without intravenous contrast. COMPARISON:  Prior CT from earlier the same day. FINDINGS: Brain: Cerebral volume within normal limits. No focal parenchymal signal abnormality. No abnormal foci of restricted diffusion to suggest acute or subacute ischemia. Gray-white matter differentiation well maintained. No encephalomalacia to suggest chronic cortical infarction. No foci of susceptibility artifact to suggest acute or chronic intracranial hemorrhage. No mass lesion, midline shift or mass effect. Ventricles normal size without hydrocephalus. No extra-axial fluid collection. Pituitary gland suprasellar region normal. Midline structures intact and normal. Vascular: Major intracranial vascular flow voids  are well maintained and normal. Skull and upper cervical spine:  Craniocervical junction within normal limits. Bone marrow signal intensity normal. No scalp soft tissue abnormality. Sinuses/Orbits: Globes and orbital soft tissues within normal limits. Mild to moderate scattered mucosal thickening noted within the sphenoethmoidal and maxillary sinuses. Few superimposed retention cyst noted. No mastoid effusion. Inner ear structures grossly normal. Other: None. IMPRESSION: Normal brain MRI. No acute intracranial abnormality identified. Electronically Signed   By: Rise Mu M.D.   On: 07/05/2020 21:50    My personal review of EKG: Rhythm SB, Rate 49 /min, QTc 464 , T wave inversions diffusely   Assessment & Plan:    Principal Problem:   Symptomatic bradycardia Active Problems:   Dizziness   Elevated troponin   1. Symptomatic bradycardia 1. HR as low as 40s in ED 2. No beta blocker 3. Consulted cards - advise echo 4. Monitor on tele 2. Dizziness 1. Likely 2/2 to above 2. CT head, CTA head and neck, and MRI brain negative 3. Echo in the AM 4. Monitor on tele 3. Minimally elevated trop 1. 35, and 37 2. Continue to trend 2 more cycles - per cards recommendation  4. Anxiety 1. Patient reports significant mental stressors at home, and normal coping mechanisms have not been working 2. He would be open to starting an SSRI     DVT Prophylaxis-  Heparin- SCDs   AM Labs Ordered, also please review Full Orders  Family Communication: No family at bedside  Code Status:  Full  Admission status: Observation  Time spent in minutes : 64   Beren Yniguez B Zierle-Ghosh DO

## 2020-07-06 NOTE — Progress Notes (Signed)
Jason Moreno  is a 35 y.o. male,with no known medical history presents to the ED with dizziness.  Patient reports that 2 days ago he started having episodes of nausea and dizziness.  They occurred mostly when standing.  He reports that he thought it was peripheral vertigo initially because he had been sleeping on hard pillows at work.  The symptoms continue to worsen.  He reports that he was avoiding eating because he was nauseous and had had episodes of vomiting.  When these spells happen he is not having any change in hearing, no tinnitus, no change in vision.  He does report that he has noticed nystagmus, with the TV looking shaky when he tries to watch it.  He has never had anything like this before.  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  He used to run every day.  He has been under a lot of stress at work, so he has not had time to run.  He reports that he used to meditate as well and has not been able to do this.  He feels very anxious at home, even just talking about his work.  He reports that his wife is a therapist and has recommended that he be on an anxiety medication.  He is open to this as well.  Patient does not smoke.  He drinks 2 to 3 glasses of wine nightly.  Patient does not use illicit drugs. Patient is not vaccinated for COVID.  COVID-19 screening test positive on 07/06/2020.  07/06/20: Patient was seen and examined at his bedside.  He reports his dizziness has improved.  He was incidentally found to have positive COVID-19 viral infection.  He denies any respiratory symptoms.  Denies any nausea at time of this visit.  Denies any abdominal pain or diarrhea.  No chest pain or lower extremity edema or pain.  Chest x-ray done on 07/06/2020 personally reviewed showed no active cardiopulmonary disease.  We will obtain inflammatory markers x1.    He had a neurological work-up for his dizziness which came back unremarkable.  Presented with mildly elevated  troponin and sinus bradycardia, 2D echo is pending.  We will obtain a TSH due to persistent bradycardia, we will also obtain orthostatic vital signs, PT OT assessment.  Please refer to H&P dictated by my partner Dr. Carren Rang on 07/06/2020 for further details of the assessment and plan.

## 2020-07-06 NOTE — Consult Note (Signed)
Cardiology Consultation:   Due to the COVID-19 pandemic, this visit was completed with telemedicine (audio/video) technology to reduce patient and provider exposure as well as to preserve personal protective equipment.   Patient ID: Loraine GripDenis Sroka MRN: [id number]; DOB: 11/08/1985  Admit date: 07/05/2020 Date of Consult: 07/06/2020  Primary Care Provider: Pcp, No Primary [facility]ologist: [facility] Primary Electrophysiologist:  None    Patient Profile:   Loraine GripDenis Po is a 35 y.o. male with a no significant past medical history who is being seen today for the evaluation of bradycardia and mildly elevated troponin at the request of Dr. Margo AyeHall.  History of Present Illness:   Mr. Juliene PinaGeoffrey is a 35 year old male with no significant past medical history.  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  He also runs in the am as well.  He has been told that he has had bradycardia at PE in the past. He has not had any hx of weakness, dizziness or syncope in the past.  He had never smoked and has no family hx of CAD.  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    In the ED, EKG showed sinus bradycardia, rate 48 bpm, with mild J point elevation with what looks like early repolarization in V2-V3 as well as T wave inversions in all inferior leads and V5-V6. No prior EKG for comparison. High-sensitivity troponin minimally elevated and flat at 37 >> 35 >> 32 >> 32 >> 32 >> 30. Chest x-ray showed no acute findings. WBC 4.1, Hgb 15.4, Plts 13.0. Na 141, K 3.9, Glucose 124, BUN 9, Cr 1.11. LFTs normal. Head CT, head/neck CTA, and brain MRI all unremarkable. COVID test came back positive.  Orths wostaticere chec wked andere normal. Cardiology consulted for further evaluation of bradycardia and elevated troponin.   He tells me that he started feeling bad on Thursday afternoon.  He went to work and got up and got dizzy and almost passed out.  He denied any  fever, chills, cough, diarrhea.  He has been nauseated and vomited if he walked too far,  He describes the dizzy as a feeling off balance with the room and spinning but did not feel like he was going to pass out.  He felt very off balance.  He has never had this before.  He denies any chest pain or pressure, SOB, PND, orthopnea.  He says that one of his workers had COVID 19 but had been out for 17 days.   History reviewed. No pertinent past medical history.  History reviewed. No pertinent surgical history.   Home Medications:  Prior to Admission medications   Medication Sig Start Date End Date Taking? Authorizing Provider  ibuprofen (ADVIL) 200 MG tablet Take 200 mg by mouth every 6 (six) hours as needed for headache or mild pain.   Yes [provider]    Inpatient Medications: Scheduled Meds: . vitamin C  500 mg Oral Daily  . aspirin EC  81 mg Oral Daily  . cholecalciferol  1,000 Units Oral Daily  . heparin  5,000 Units Subcutaneous Q8H  . meclizine  25 mg Oral TID  . rosuvastatin  10 mg Oral Daily  . zinc sulfate  220 mg Oral Daily   Continuous Infusions:  PRN Meds: acetaminophen, [START ON 07/09/2020] meclizine, ondansetron (ZOFRAN) IV  Allergies:   No Known Allergies  Social History:   Social History   Socioeconomic  History  . Marital status: Married    Spouse name: Not on file  . Number of children: Not on file  . Years of education: Not on file  . Highest education level: Not on file  Occupational History  . Not on file  Tobacco Use  . Smoking status: Never Smoker  . Smokeless tobacco: Never Used  Substance and Sexual Activity  . Alcohol use: Yes    Comment: occ  . Drug use: Yes    Types: Marijuana    Comment: occ  . Sexual activity: Yes  Other Topics Concern  . Not on file  Social History Narrative  . Not on file   Social Determinants of Health   Financial Resource Strain: Not on file  Food Insecurity: Not on file  Transportation Needs: Not on  file  Physical Activity: Not on file  Stress: Not on file  Social Connections: Not on file  Intimate Partner Violence: Not on file    Family History:    Family History  Family history unknown: Yes     ROS:  Please see the history of present illness.   All other ROS reviewed and negative.     Physical Exam/Data:   Vitals:   07/06/20 0715 07/06/20 1000 07/06/20 1342 07/06/20 1635  BP: 130/86 (!) 147/97 (!) 143/96 (!) 133/98  Pulse: (!) 56 64 (!) 58   Resp: 16 (!) 22 17   Temp:   99.1 F (37.3 C)   TempSrc:   Oral   SpO2: 99% 99% 100%   Weight:      Height:       No intake or output data in the 24 hours ending 07/06/20 1649 Last 3 Weights 07/05/2020 01/30/2017 05/05/2016  Weight (lbs) 165 lb 180 lb 177 lb  Weight (kg) 74.844 kg 81.647 kg 80.287 kg     Body mass index is 25.84 kg/m.   Well nourished, well developed male in no acute distress. Well appearing, alert and conversant, regular work of breathing,  good skin color  Eyes- anicteric mouth- oral mucosa is pink  neuro- grossly intact skin- no apparent rash or lesions or cyanosis  EKG:  The EKG was personally reviewed and demonstrates: Sinus bradycardia, rate 48 bpm, with mild J point elevation with what looks like early repolarization in V2-V3 as well as T wave inversions in all inferior leads and V5-V6.   Relevant CV Studies:  Echocardiogram 07/06/2020: Impressions: 1. Left ventricular ejection fraction, by estimation, is 55 to 60%. The  left ventricle has normal function. The left ventricle has no regional  wall motion abnormalities. There is mild concentric left ventricular  hypertrophy. Left ventricular diastolic  parameters are consistent with Grade I diastolic dysfunction (impaired  relaxation).  2. Right ventricular systolic function is normal. The right ventricular  size is normal.  3. The mitral valve is grossly normal. Trivial mitral valve  regurgitation.  4. The aortic valve is tricuspid.  There is mild thickening of the aortic  valve. Aortic valve regurgitation is not visualized.  5. The inferior vena cava is normal in size with greater than 50%  respiratory variability, suggesting right atrial pressure of 3 mmHg.   Laboratory Data:  Chemistry Recent Labs  Lab 07/05/20 1617 07/06/20 1100  NA 141 139  K 3.9 4.0  CL 106 108  CO2 22 22  GLUCOSE 124* 109*  BUN 9 11  CREATININE 1.11 1.20  CALCIUM 9.1 9.2  GFRNONAA >60 >60  ANIONGAP 13 9  Recent Labs  Lab 07/05/20 1617 07/06/20 1100  PROT 7.5 7.4  ALBUMIN 4.3 4.1  AST 30 30  ALT 38 37  ALKPHOS 40 42  BILITOT 0.7 0.6   Hematology Recent Labs  Lab 07/05/20 1617 07/06/20 1100  WBC 4.1 3.8*  RBC 4.96 4.82  HGB 15.4 14.7  HCT 46.9 45.2  MCV 94.6 93.8  MCH 31.0 30.5  MCHC 32.8 32.5  RDW 13.0 13.1  PLT 211 226   Cardiac EnzymesNo results for input(s): TROPONINI in the last 168 hours. No results for input(s): TROPIPOC in the last 168 hours.  BNPNo results for input(s): BNP, PROBNP in the last 168 hours.  DDimer  Recent Labs  Lab 07/06/20 1100  DDIMER 0.34    Radiology/Studies:  CT Angio Head W or Wo Contrast  Result Date: 07/05/2020 CLINICAL DATA:  Initial evaluation for acute dizziness. EXAM: CT ANGIOGRAPHY HEAD AND NECK TECHNIQUE: Multidetector CT imaging of the head and neck was performed using the standard protocol during bolus administration of intravenous contrast. Multiplanar CT image reconstructions and MIPs were obtained to evaluate the vascular anatomy. Carotid stenosis measurements (when applicable) are obtained utilizing NASCET criteria, using the distal internal carotid diameter as the denominator. CONTRAST:  29mL OMNIPAQUE IOHEXOL 350 MG/ML SOLN COMPARISON:  Prior CT and MRI from earlier same day. FINDINGS: CTA NECK FINDINGS Aortic arch: Visualized aortic arch of normal caliber with normal 3 vessel morphology. No hemodynamically significant stenosis about the origin of the great vessels.  Right carotid system: Right common and internal carotid arteries widely patent without stenosis, dissection or occlusion. Left carotid system: Left common and internal carotid arteries widely patent without stenosis, dissection or occlusion. Vertebral arteries: Both vertebral arteries arise from the subclavian arteries. No proximal subclavian artery stenosis. Left vertebral artery slightly dominant. Vertebral arteries patent without stenosis, dissection or occlusion. Skeleton: No visible acute osseous abnormality. No discrete or worrisome osseous lesions. Other neck: No other acute soft tissue abnormality within the neck. No mass or adenopathy. Mild-to-moderate mucosal thickening noted within the paranasal sinuses, likely inflammatory/allergic in nature. Upper chest: Visualized upper chest demonstrates no acute finding. Review of the MIP images confirms the above findings CTA HEAD FINDINGS Anterior circulation: Both internal carotid arteries widely patent to the termini without stenosis or other abnormality. A1 segments widely patent. Normal anterior communicating artery complex. Anterior cerebral arteries patent to their distal aspects without stenosis. No M1 stenosis or occlusion. Normal MCA bifurcations. Distal MCA branches well perfused and symmetric. Posterior circulation: Both V4 segments widely patent to the vertebrobasilar junction. Both PICA origins patent and normal. Basilar widely patent to its distal aspect. Superior cerebellar arteries patent bilaterally. Both PCA supplied via the basilar as well as small bilateral posterior communicating arteries. PCAs widely patent to their distal aspects. Venous sinuses: Patent. Anatomic variants: None significant.  No aneurysm. Review of the MIP images confirms the above findings IMPRESSION: Normal CTA of the head and neck. No large vessel occlusion, hemodynamically significant stenosis, or other acute vascular abnormality. Electronically Signed   By: Rise Mu M.D.   On: 07/05/2020 23:14   CT HEAD WO CONTRAST  Result Date: 07/05/2020 CLINICAL DATA:  Vertigo. EXAM: CT HEAD WITHOUT CONTRAST TECHNIQUE: Contiguous axial images were obtained from the base of the skull through the vertex without intravenous contrast. COMPARISON:  None. FINDINGS: Brain: No acute infarct or intracranial hemorrhage. No mass lesion. No midline shift, ventriculomegaly or extra-axial fluid collection. Vascular: No hyperdense vessel or unexpected calcification. Skull: Negative for fracture  or focal lesion. Sinuses/Orbits: No acute finding.  Mild pansinus mucosal thickening. Other: None. IMPRESSION: No acute intracranial process. Electronically Signed   By: Stana Bunting M.D.   On: 07/05/2020 18:01   CT Angio Neck W and/or Wo Contrast  Result Date: 07/05/2020 CLINICAL DATA:  Initial evaluation for acute dizziness. EXAM: CT ANGIOGRAPHY HEAD AND NECK TECHNIQUE: Multidetector CT imaging of the head and neck was performed using the standard protocol during bolus administration of intravenous contrast. Multiplanar CT image reconstructions and MIPs were obtained to evaluate the vascular anatomy. Carotid stenosis measurements (when applicable) are obtained utilizing NASCET criteria, using the distal internal carotid diameter as the denominator. CONTRAST:  28mL OMNIPAQUE IOHEXOL 350 MG/ML SOLN COMPARISON:  Prior CT and MRI from earlier same day. FINDINGS: CTA NECK FINDINGS Aortic arch: Visualized aortic arch of normal caliber with normal 3 vessel morphology. No hemodynamically significant stenosis about the origin of the great vessels. Right carotid system: Right common and internal carotid arteries widely patent without stenosis, dissection or occlusion. Left carotid system: Left common and internal carotid arteries widely patent without stenosis, dissection or occlusion. Vertebral arteries: Both vertebral arteries arise from the subclavian arteries. No proximal subclavian artery  stenosis. Left vertebral artery slightly dominant. Vertebral arteries patent without stenosis, dissection or occlusion. Skeleton: No visible acute osseous abnormality. No discrete or worrisome osseous lesions. Other neck: No other acute soft tissue abnormality within the neck. No mass or adenopathy. Mild-to-moderate mucosal thickening noted within the paranasal sinuses, likely inflammatory/allergic in nature. Upper chest: Visualized upper chest demonstrates no acute finding. Review of the MIP images confirms the above findings CTA HEAD FINDINGS Anterior circulation: Both internal carotid arteries widely patent to the termini without stenosis or other abnormality. A1 segments widely patent. Normal anterior communicating artery complex. Anterior cerebral arteries patent to their distal aspects without stenosis. No M1 stenosis or occlusion. Normal MCA bifurcations. Distal MCA branches well perfused and symmetric. Posterior circulation: Both V4 segments widely patent to the vertebrobasilar junction. Both PICA origins patent and normal. Basilar widely patent to its distal aspect. Superior cerebellar arteries patent bilaterally. Both PCA supplied via the basilar as well as small bilateral posterior communicating arteries. PCAs widely patent to their distal aspects. Venous sinuses: Patent. Anatomic variants: None significant.  No aneurysm. Review of the MIP images confirms the above findings IMPRESSION: Normal CTA of the head and neck. No large vessel occlusion, hemodynamically significant stenosis, or other acute vascular abnormality. Electronically Signed   By: Rise Mu M.D.   On: 07/05/2020 23:14   MR BRAIN WO CONTRAST  Result Date: 07/05/2020 CLINICAL DATA:  Initial evaluation for acute dizziness. EXAM: MRI HEAD WITHOUT CONTRAST TECHNIQUE: Multiplanar, multiecho pulse sequences of the brain and surrounding structures were obtained without intravenous contrast. COMPARISON:  Prior CT from earlier the same  day. FINDINGS: Brain: Cerebral volume within normal limits. No focal parenchymal signal abnormality. No abnormal foci of restricted diffusion to suggest acute or subacute ischemia. Gray-white matter differentiation well maintained. No encephalomalacia to suggest chronic cortical infarction. No foci of susceptibility artifact to suggest acute or chronic intracranial hemorrhage. No mass lesion, midline shift or mass effect. Ventricles normal size without hydrocephalus. No extra-axial fluid collection. Pituitary gland suprasellar region normal. Midline structures intact and normal. Vascular: Major intracranial vascular flow voids are well maintained and normal. Skull and upper cervical spine: Craniocervical junction within normal limits. Bone marrow signal intensity normal. No scalp soft tissue abnormality. Sinuses/Orbits: Globes and orbital soft tissues within normal limits. Mild to moderate  scattered mucosal thickening noted within the sphenoethmoidal and maxillary sinuses. Few superimposed retention cyst noted. No mastoid effusion. Inner ear structures grossly normal. Other: None. IMPRESSION: Normal brain MRI. No acute intracranial abnormality identified. Electronically Signed   By: Rise Mu M.D.   On: 07/05/2020 21:50   DG CHEST PORT 1 VIEW  Result Date: 07/06/2020 CLINICAL DATA:  COVID positive. EXAM: PORTABLE CHEST 1 VIEW COMPARISON:  None. FINDINGS: Normal heart size and mediastinal contours. No acute infiltrate or edema. No effusion or pneumothorax. No acute osseous findings. Artifact from EKG leads. IMPRESSION: No active disease. Electronically Signed   By: Marnee Spring M.D.   On: 07/06/2020 09:35   ECHOCARDIOGRAM COMPLETE  Result Date: 07/06/2020    ECHOCARDIOGRAM REPORT   Patient Name:   KIRK BASQUEZ Date of Exam: 07/06/2020 Medical Rec #:  [id number]      Height:       67.0 in Accession #:    [id number]     Weight:       165.0 lb Date of Birth:  1986-05-31       BSA:          1.863 m  Patient Age:    34 years       BP:           143/96 mmHg Patient Gender: M              HR:           51 bpm. Exam Location:  Inpatient Procedure: Limited Echo, Cardiac Doppler and Limited Color Doppler Indications:    abnormal ecg  History:        Patient has no prior history of Echocardiogram examinations.                 Covid, Arrythmias:bradycardia; Signs/Symptoms:elevated troponin.  Sonographer:    Delcie Roch Referring Phys: [id number] ASIA B ZIERLE-GHOSH IMPRESSIONS  1. Left ventricular ejection fraction, by estimation, is 55 to 60%. The left ventricle has normal function. The left ventricle has no regional wall motion abnormalities. There is mild concentric left ventricular hypertrophy. Left ventricular diastolic parameters are consistent with Grade I diastolic dysfunction (impaired relaxation).  2. Right ventricular systolic function is normal. The right ventricular size is normal.  3. The mitral valve is grossly normal. Trivial mitral valve regurgitation.  4. The aortic valve is tricuspid. There is mild thickening of the aortic valve. Aortic valve regurgitation is not visualized.  5. The inferior vena cava is normal in size with greater than 50% respiratory variability, suggesting right atrial pressure of 3 mmHg. FINDINGS  Left Ventricle: Left ventricular ejection fraction, by estimation, is 55 to 60%. The left ventricle has normal function. The left ventricle has no regional wall motion abnormalities. The left ventricular internal cavity size was normal in size. There is  mild concentric left ventricular hypertrophy. Left ventricular diastolic parameters are consistent with Grade I diastolic dysfunction (impaired relaxation). Right Ventricle: The right ventricular size is normal. Right vetricular wall thickness was not well visualized. Right ventricular systolic function is normal. Left Atrium: Left atrial size was normal in size. Right Atrium: Right atrial size was normal in size. Pericardium: There  is no evidence of pericardial effusion. Mitral Valve: The mitral valve is grossly normal. There is mild thickening of the mitral valve leaflet(s). Mild mitral annular calcification. Trivial mitral valve regurgitation. Tricuspid Valve: The tricuspid valve is normal in structure. Tricuspid valve regurgitation is trivial. Aortic Valve: The aortic valve is tricuspid. There is  mild thickening of the aortic valve. Aortic valve regurgitation is not visualized. Pulmonic Valve: The pulmonic valve was normal in structure. Pulmonic valve regurgitation is trivial. Aorta: The aortic root is normal in size and structure. Venous: The inferior vena cava is normal in size with greater than 50% respiratory variability, suggesting right atrial pressure of 3 mmHg. IAS/Shunts: No atrial level shunt detected by color flow Doppler.  LEFT VENTRICLE PLAX 2D LVIDd:         4.70 cm  Diastology LVIDs:         3.20 cm  LV e' medial:    9.68 cm/s LV PW:         1.20 cm  LV E/e' medial:  7.2 LV IVS:        1.10 cm  LV e' lateral:   13.10 cm/s LVOT diam:     2.10 cm  LV E/e' lateral: 5.3 LVOT Area:     3.46 cm  IVC IVC diam: 1.30 cm LEFT ATRIUM         Index LA diam:    3.50 cm 1.88 cm/m   AORTA Ao Root diam: 2.90 cm MITRAL VALVE MV Area (PHT): 3.06 cm    SHUNTS MV Decel Time: 248 msec    Systemic Diam: 2.10 cm MV E velocity: 69.40 cm/s Laurance FlattenHeather Pemberton MD Electronically signed by Laurance FlattenHeather Pemberton MD Signature Date/Time: 07/06/2020/3:12:20 PM    Final     Assessment and Plan:   Sinus Bradycardia - Patient presented with dizziness and was found to be mildly bradycardic with rates in the 40's.-60's  - EKG showed sinus bradycardia, rate 48 bpm, with mild J point elevation with what looks like early repolarization in V2-V3 as well as T wave inversions in all inferior leads and V5-V6. - Echo showed LVEF of 55-60% with normal wall motion and grade 1 diastolic dysfunction. - Patient is a former Geophysicist/field seismologistprofessional soccer player so suspect he has  increased vagal tone. Do not suspect that this is the cause of his dizziness.  -his BP is stable and he mounts a normal HR response with orthostatics with HR up to 67bpm. -check event monitor to assess for symptomatic bradycardia outptient -TSH is normal  Elevated Troponin - High-sensitivity troponin minimally elevated and flat at 37 >> 35 >> 32 >> 32 >> 32 >> 30. - EKG has diffuse T wave abnormality inferolaterally  - Echo with normal LVEF and wall motion and reportedly mild LVH on echo but I reviewed the measurements and for a male the wall thickness is normal and not c/w LVH - Not consistent with ACS and  trop elevated likely related to demand ischemia in the setting of COVID 19 infection -Consider outpatient coronary CTA once patient has recovered from COVID.  Dizziness -sx c/w vertigo and associated with N/V and COVID 19 infection -he is not orthostatic on exam    For questions or updates, please contact [facility] Please consult [email or web address] for contact info under     Signed, Corrin ParkerCallie E Goodrich, PA-C  07/06/2020 4:49 PM

## 2020-07-06 NOTE — ED Notes (Signed)
Attempted to call report, floor not aware of covid status

## 2020-07-06 NOTE — Plan of Care (Signed)

## 2020-07-07 ENCOUNTER — Other Ambulatory Visit: Payer: Self-pay | Admitting: Student

## 2020-07-07 DIAGNOSIS — U071 COVID-19: Secondary | ICD-10-CM | POA: Diagnosis not present

## 2020-07-07 DIAGNOSIS — R001 Bradycardia, unspecified: Secondary | ICD-10-CM

## 2020-07-07 DIAGNOSIS — R9431 Abnormal electrocardiogram [ECG] [EKG]: Secondary | ICD-10-CM | POA: Diagnosis not present

## 2020-07-07 DIAGNOSIS — R42 Dizziness and giddiness: Secondary | ICD-10-CM | POA: Diagnosis not present

## 2020-07-07 MED ORDER — AMLODIPINE BESYLATE 5 MG PO TABS: 5.0000 mg | ORAL_TABLET | Freq: Every day | ORAL | 0 refills | Status: DC

## 2020-07-07 MED ORDER — HYDROXYZINE PAMOATE 100 MG PO CAPS: 100.0000 mg | ORAL_CAPSULE | Freq: Three times a day (TID) | ORAL | 0 refills | Status: DC | PRN

## 2020-07-07 MED ORDER — ROSUVASTATIN CALCIUM 10 MG PO TABS: 10.0000 mg | ORAL_TABLET | Freq: Every day | ORAL | 0 refills | Status: DC

## 2020-07-07 MED ORDER — MECLIZINE HCL 25 MG PO TABS: 25.0000 mg | ORAL_TABLET | Freq: Three times a day (TID) | ORAL | 0 refills | Status: DC | PRN

## 2020-07-07 NOTE — Progress Notes (Addendum)
OT Cancellation Note  Patient Details Name: Jason Moreno MRN: [id number] DOB: 09-23-1985   Cancelled Treatment:    Reason Eval/Treat Not Completed: OT screened, no needs identified, will sign off. Per chart review, pt is a 35 y/o male with incidental COVID+ finding, no signs of respiratory distress or O2 needs as well as improvement noted in dizziness. Per RN, pt has mobilized to bathroom without assistance and low fall risk. No formal OT eval or skilled services needed at this time. Please reconsult if needs change.   Lorre Munroe 07/07/2020, 9:19 AM

## 2020-07-07 NOTE — Progress Notes (Signed)
Ordered Event Monitor for further evaluation of dizziness and bradycardia and coronary CTA for further evaluation of abnormal EKG and elevated troponin  per Dr. Mayford Knife Please see her rounding note from today for more information.  Corrin Parker, PA-C 07/07/2020 12:06 PM

## 2020-07-07 NOTE — Progress Notes (Addendum)
Progress Note  Patient Name: Jason Moreno Date of Encounter: 07/07/2020  [facility] Cardiologist: No primary care provider on file.   Subjective   Denies any chest pain or SOB.  HR 50-78bpm on tele and mainly bradycardic while sleeping  Inpatient Medications    Scheduled Meds: . vitamin C  500 mg Oral Daily  . aspirin EC  81 mg Oral Daily  . cholecalciferol  1,000 Units Oral Daily  . heparin  5,000 Units Subcutaneous Q8H  . hydrOXYzine  10 mg Oral TID  . meclizine  25 mg Oral TID  . rosuvastatin  10 mg Oral Daily  . zinc sulfate  220 mg Oral Daily   Continuous Infusions:  PRN Meds: acetaminophen, [START ON 07/09/2020] meclizine, ondansetron (ZOFRAN) IV   Vital Signs    Vitals:   07/06/20 1635 07/06/20 2023 07/07/20 0528 07/07/20 0844  BP: (!) 133/98 (!) 142/99 138/87 (!) 142/95  Pulse:  (!) 53 (!) 58 (!) 54  Resp:  16 16 19   Temp:  98.1 F (36.7 C) 98.3 F (36.8 C)   TempSrc:  Oral Oral   SpO2:  100% 100% 100%  Weight:   86.1 kg   Height:        Intake/Output Summary (Last 24 hours) at 07/07/2020 0905 Last data filed at 07/07/2020 0531 Gross per 24 hour  Intake 120 ml  Output 200 ml  Net -80 ml   Last 3 Weights 07/07/2020 07/05/2020 01/30/2017  Weight (lbs) 189 lb 13.1 oz 165 lb 180 lb  Weight (kg) 86.1 kg 74.844 kg 81.647 kg      Telemetry    Sinus bradycardia to NSR HR 50-78bpm - Personally Reviewed  ECG    No new EKG to review - Personally Reviewed  Physical Exam   No performed due to COVID 19 infection  Labs    High Sensitivity Troponin:   Recent Labs  Lab 07/05/20 1850 07/06/20 0243 07/06/20 0537 07/06/20 1100 07/06/20 1452  TROPONINIHS 35* 32* 32* 32* 30*      Chemistry Recent Labs  Lab 07/05/20 1617 07/06/20 1100  NA 141 139  K 3.9 4.0  CL 106 108  CO2 22 22  GLUCOSE 124* 109*  BUN 9 11  CREATININE 1.11 1.20  CALCIUM 9.1 9.2  PROT 7.5 7.4  ALBUMIN 4.3 4.1  AST 30 30  ALT 38 37  ALKPHOS 40 42  BILITOT 0.7 0.6   GFRNONAA >60 >60  ANIONGAP 13 9     Hematology Recent Labs  Lab 07/05/20 1617 07/06/20 1100  WBC 4.1 3.8*  RBC 4.96 4.82  HGB 15.4 14.7  HCT 46.9 45.2  MCV 94.6 93.8  MCH 31.0 30.5  MCHC 32.8 32.5  RDW 13.0 13.1  PLT 211 226    BNPNo results for input(s): BNP, PROBNP in the last 168 hours.   DDimer  Recent Labs  Lab 07/06/20 1100  DDIMER 0.34     Radiology    CT Angio Head W or Wo Contrast  Result Date: 07/05/2020 CLINICAL DATA:  Initial evaluation for acute dizziness. EXAM: CT ANGIOGRAPHY HEAD AND NECK TECHNIQUE: Multidetector CT imaging of the head and neck was performed using the standard protocol during bolus administration of intravenous contrast. Multiplanar CT image reconstructions and MIPs were obtained to evaluate the vascular anatomy. Carotid stenosis measurements (when applicable) are obtained utilizing NASCET criteria, using the distal internal carotid diameter as the denominator. CONTRAST:  42mL OMNIPAQUE IOHEXOL 350 MG/ML SOLN COMPARISON:  Prior CT and  MRI from earlier same day. FINDINGS: CTA NECK FINDINGS Aortic arch: Visualized aortic arch of normal caliber with normal 3 vessel morphology. No hemodynamically significant stenosis about the origin of the great vessels. Right carotid system: Right common and internal carotid arteries widely patent without stenosis, dissection or occlusion. Left carotid system: Left common and internal carotid arteries widely patent without stenosis, dissection or occlusion. Vertebral arteries: Both vertebral arteries arise from the subclavian arteries. No proximal subclavian artery stenosis. Left vertebral artery slightly dominant. Vertebral arteries patent without stenosis, dissection or occlusion. Skeleton: No visible acute osseous abnormality. No discrete or worrisome osseous lesions. Other neck: No other acute soft tissue abnormality within the neck. No mass or adenopathy. Mild-to-moderate mucosal thickening noted within the  paranasal sinuses, likely inflammatory/allergic in nature. Upper chest: Visualized upper chest demonstrates no acute finding. Review of the MIP images confirms the above findings CTA HEAD FINDINGS Anterior circulation: Both internal carotid arteries widely patent to the termini without stenosis or other abnormality. A1 segments widely patent. Normal anterior communicating artery complex. Anterior cerebral arteries patent to their distal aspects without stenosis. No M1 stenosis or occlusion. Normal MCA bifurcations. Distal MCA branches well perfused and symmetric. Posterior circulation: Both V4 segments widely patent to the vertebrobasilar junction. Both PICA origins patent and normal. Basilar widely patent to its distal aspect. Superior cerebellar arteries patent bilaterally. Both PCA supplied via the basilar as well as small bilateral posterior communicating arteries. PCAs widely patent to their distal aspects. Venous sinuses: Patent. Anatomic variants: None significant.  No aneurysm. Review of the MIP images confirms the above findings IMPRESSION: Normal CTA of the head and neck. No large vessel occlusion, hemodynamically significant stenosis, or other acute vascular abnormality. Electronically Signed   By: Rise MuBenjamin  McClintock M.D.   On: 07/05/2020 23:14   CT HEAD WO CONTRAST  Result Date: 07/05/2020 CLINICAL DATA:  Vertigo. EXAM: CT HEAD WITHOUT CONTRAST TECHNIQUE: Contiguous axial images were obtained from the base of the skull through the vertex without intravenous contrast. COMPARISON:  None. FINDINGS: Brain: No acute infarct or intracranial hemorrhage. No mass lesion. No midline shift, ventriculomegaly or extra-axial fluid collection. Vascular: No hyperdense vessel or unexpected calcification. Skull: Negative for fracture or focal lesion. Sinuses/Orbits: No acute finding.  Mild pansinus mucosal thickening. Other: None. IMPRESSION: No acute intracranial process. Electronically Signed   By: Stana Buntinghikanele   Emekauwa M.D.   On: 07/05/2020 18:01   CT Angio Neck W and/or Wo Contrast  Result Date: 07/05/2020 CLINICAL DATA:  Initial evaluation for acute dizziness. EXAM: CT ANGIOGRAPHY HEAD AND NECK TECHNIQUE: Multidetector CT imaging of the head and neck was performed using the standard protocol during bolus administration of intravenous contrast. Multiplanar CT image reconstructions and MIPs were obtained to evaluate the vascular anatomy. Carotid stenosis measurements (when applicable) are obtained utilizing NASCET criteria, using the distal internal carotid diameter as the denominator. CONTRAST:  75mL OMNIPAQUE IOHEXOL 350 MG/ML SOLN COMPARISON:  Prior CT and MRI from earlier same day. FINDINGS: CTA NECK FINDINGS Aortic arch: Visualized aortic arch of normal caliber with normal 3 vessel morphology. No hemodynamically significant stenosis about the origin of the great vessels. Right carotid system: Right common and internal carotid arteries widely patent without stenosis, dissection or occlusion. Left carotid system: Left common and internal carotid arteries widely patent without stenosis, dissection or occlusion. Vertebral arteries: Both vertebral arteries arise from the subclavian arteries. No proximal subclavian artery stenosis. Left vertebral artery slightly dominant. Vertebral arteries patent without stenosis, dissection or occlusion. Skeleton: No visible  acute osseous abnormality. No discrete or worrisome osseous lesions. Other neck: No other acute soft tissue abnormality within the neck. No mass or adenopathy. Mild-to-moderate mucosal thickening noted within the paranasal sinuses, likely inflammatory/allergic in nature. Upper chest: Visualized upper chest demonstrates no acute finding. Review of the MIP images confirms the above findings CTA HEAD FINDINGS Anterior circulation: Both internal carotid arteries widely patent to the termini without stenosis or other abnormality. A1 segments widely patent. Normal  anterior communicating artery complex. Anterior cerebral arteries patent to their distal aspects without stenosis. No M1 stenosis or occlusion. Normal MCA bifurcations. Distal MCA branches well perfused and symmetric. Posterior circulation: Both V4 segments widely patent to the vertebrobasilar junction. Both PICA origins patent and normal. Basilar widely patent to its distal aspect. Superior cerebellar arteries patent bilaterally. Both PCA supplied via the basilar as well as small bilateral posterior communicating arteries. PCAs widely patent to their distal aspects. Venous sinuses: Patent. Anatomic variants: None significant.  No aneurysm. Review of the MIP images confirms the above findings IMPRESSION: Normal CTA of the head and neck. No large vessel occlusion, hemodynamically significant stenosis, or other acute vascular abnormality. Electronically Signed   By: Rise MuBenjamin  McClintock M.D.   On: 07/05/2020 23:14   MR BRAIN WO CONTRAST  Result Date: 07/05/2020 CLINICAL DATA:  Initial evaluation for acute dizziness. EXAM: MRI HEAD WITHOUT CONTRAST TECHNIQUE: Multiplanar, multiecho pulse sequences of the brain and surrounding structures were obtained without intravenous contrast. COMPARISON:  Prior CT from earlier the same day. FINDINGS: Brain: Cerebral volume within normal limits. No focal parenchymal signal abnormality. No abnormal foci of restricted diffusion to suggest acute or subacute ischemia. Gray-white matter differentiation well maintained. No encephalomalacia to suggest chronic cortical infarction. No foci of susceptibility artifact to suggest acute or chronic intracranial hemorrhage. No mass lesion, midline shift or mass effect. Ventricles normal size without hydrocephalus. No extra-axial fluid collection. Pituitary gland suprasellar region normal. Midline structures intact and normal. Vascular: Major intracranial vascular flow voids are well maintained and normal. Skull and upper cervical spine:  Craniocervical junction within normal limits. Bone marrow signal intensity normal. No scalp soft tissue abnormality. Sinuses/Orbits: Globes and orbital soft tissues within normal limits. Mild to moderate scattered mucosal thickening noted within the sphenoethmoidal and maxillary sinuses. Few superimposed retention cyst noted. No mastoid effusion. Inner ear structures grossly normal. Other: None. IMPRESSION: Normal brain MRI. No acute intracranial abnormality identified. Electronically Signed   By: Rise MuBenjamin  McClintock M.D.   On: 07/05/2020 21:50   DG CHEST PORT 1 VIEW  Result Date: 07/06/2020 CLINICAL DATA:  COVID positive. EXAM: PORTABLE CHEST 1 VIEW COMPARISON:  None. FINDINGS: Normal heart size and mediastinal contours. No acute infiltrate or edema. No effusion or pneumothorax. No acute osseous findings. Artifact from EKG leads. IMPRESSION: No active disease. Electronically Signed   By: Marnee SpringJonathon  Watts M.D.   On: 07/06/2020 09:35   ECHOCARDIOGRAM COMPLETE  Result Date: 07/06/2020    ECHOCARDIOGRAM REPORT   Patient Name:   Jason Moreno Date of Exam: 07/06/2020 Medical Rec #:  [id number]      Height:       67.0 in Accession #:    [id number]     Weight:       165.0 lb Date of Birth:  09/16/1985       BSA:          1.863 m Patient Age:    34 years       BP:  143/96 mmHg Patient Gender: M              HR:           51 bpm. Exam Location:  Inpatient Procedure: Limited Echo, Cardiac Doppler and Limited Color Doppler Indications:    abnormal ecg  History:        Patient has no prior history of Echocardiogram examinations.                 Covid, Arrythmias:bradycardia; Signs/Symptoms:elevated troponin.  Sonographer:    Delcie Roch Referring Phys: [id number] ASIA B ZIERLE-GHOSH IMPRESSIONS  1. Left ventricular ejection fraction, by estimation, is 55 to 60%. The left ventricle has normal function. The left ventricle has no regional wall motion abnormalities. There is mild concentric left ventricular  hypertrophy. Left ventricular diastolic parameters are consistent with Grade I diastolic dysfunction (impaired relaxation).  2. Right ventricular systolic function is normal. The right ventricular size is normal.  3. The mitral valve is grossly normal. Trivial mitral valve regurgitation.  4. The aortic valve is tricuspid. There is mild thickening of the aortic valve. Aortic valve regurgitation is not visualized.  5. The inferior vena cava is normal in size with greater than 50% respiratory variability, suggesting right atrial pressure of 3 mmHg. FINDINGS  Left Ventricle: Left ventricular ejection fraction, by estimation, is 55 to 60%. The left ventricle has normal function. The left ventricle has no regional wall motion abnormalities. The left ventricular internal cavity size was normal in size. There is  mild concentric left ventricular hypertrophy. Left ventricular diastolic parameters are consistent with Grade I diastolic dysfunction (impaired relaxation). Right Ventricle: The right ventricular size is normal. Right vetricular wall thickness was not well visualized. Right ventricular systolic function is normal. Left Atrium: Left atrial size was normal in size. Right Atrium: Right atrial size was normal in size. Pericardium: There is no evidence of pericardial effusion. Mitral Valve: The mitral valve is grossly normal. There is mild thickening of the mitral valve leaflet(s). Mild mitral annular calcification. Trivial mitral valve regurgitation. Tricuspid Valve: The tricuspid valve is normal in structure. Tricuspid valve regurgitation is trivial. Aortic Valve: The aortic valve is tricuspid. There is mild thickening of the aortic valve. Aortic valve regurgitation is not visualized. Pulmonic Valve: The pulmonic valve was normal in structure. Pulmonic valve regurgitation is trivial. Aorta: The aortic root is normal in size and structure. Venous: The inferior vena cava is normal in size with greater than 50%  respiratory variability, suggesting right atrial pressure of 3 mmHg. IAS/Shunts: No atrial level shunt detected by color flow Doppler.  LEFT VENTRICLE PLAX 2D LVIDd:         4.70 cm  Diastology LVIDs:         3.20 cm  LV e' medial:    9.68 cm/s LV PW:         1.20 cm  LV E/e' medial:  7.2 LV IVS:        1.10 cm  LV e' lateral:   13.10 cm/s LVOT diam:     2.10 cm  LV E/e' lateral: 5.3 LVOT Area:     3.46 cm  IVC IVC diam: 1.30 cm LEFT ATRIUM         Index LA diam:    3.50 cm 1.88 cm/m   AORTA Ao Root diam: 2.90 cm MITRAL VALVE MV Area (PHT): 3.06 cm    SHUNTS MV Decel Time: 248 msec    Systemic Diam: 2.10 cm MV E velocity:  69.40 cm/s Laurance Flatten MD Electronically signed by Laurance Flatten MD Signature Date/Time: 07/06/2020/3:12:20 PM    Final     Cardiac Studies   2D echo 07/07/2019 IMPRESSIONS   1. Left ventricular ejection fraction, by estimation, is 55 to 60%. The  left ventricle has normal function. The left ventricle has no regional  wall motion abnormalities. There is mild concentric left ventricular  hypertrophy. Left ventricular diastolic  parameters are consistent with Grade I diastolic dysfunction (impaired  relaxation).  2. Right ventricular systolic function is normal. The right ventricular  size is normal.  3. The mitral valve is grossly normal. Trivial mitral valve  regurgitation.  4. The aortic valve is tricuspid. There is mild thickening of the aortic  valve. Aortic valve regurgitation is not visualized.  5. The inferior vena cava is normal in size with greater than 50%  respiratory variability, suggesting right atrial pressure of 3 mmHg.   Patient Profile     35 y.o. male with a no significant past medical history who is being seen today for the evaluation of bradycardia and mildly elevated troponin at the request of Dr. Margo Aye  Assessment & Plan    Sinus Bradycardia - Patient presented with dizziness and was found to be mildly bradycardic with rates in the  40's-60's.  Dizziness c/w Vertigo.  - EKG showed sinus bradycardia, rate 48 bpm, with mild J point elevation with what looks like early repolarization in V2-V3 as well as T wave inversions in all inferior leads and V5-V6. - Echo showed LVEF of 55-60% with normal wall motion and grade 1 diastolic dysfunction. - Patient is a former Geophysicist/field seismologist so suspect he has increased vagal tone. Do not suspect that this is the cause of his dizziness as orthostatics are normal and his sx are more c/w vertigo and he is actually hypertensive and not hypotensive.  -his BP is stable and he mounts a normal HR response with orthostatics with HR going from 54-40mmHg -recommend outpt event monitor to assess for symptomatic bradycardia -TSH is normal  Elevated Troponin - High-sensitivity troponin minimally elevated and flat at 37 >> 35 >> 32 >> 32 >> 32 >> 30. - EKG has diffuse T wave abnormality inferolaterally  - Echo with normal LVEF and wall motion and reportedly mild LVH on echo but I reviewed the measurements and for a male the wall thickness is normal and not c/w LVH - Not consistent with ACS and trop elevated likely related to demand ischemia in the setting of COVID 19 infection -Consider outpatient coronary CTA once patient has recovered from COVID.  Dizziness -sx c/w vertigo and associated with N/V and COVID 19 infection -he is not orthostatic on exam  HTN -needs Rx for elevated BPs -per [facility]  [facility] will sign off.   Medication Recommendations:  Crestor 10mg  daily, BP med per [facility] Other recommendations (labs, testing, etc):  Coronary CTA in 3 weeks (after recovered from COVID 19) and event monitor  Follow up as an outpatient:  4 weeks with PA  For questions or updates, please contact [facility] Please consult [email or web address] for contact info under        Signed, [facility], MD  07/07/2020, 9:05 AM

## 2020-07-07 NOTE — Evaluation (Signed)
Physical Therapy Evaluation Patient Details Name: Jason Moreno MRN: [id number] DOB: [birth year] Today's Date: 07/07/2020   History of Present Illness  35 year old male with no significant past medical history.  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  He also runs in the am as well.  He has been told that he has had bradycardia at PE in the past. He has not had any hx of weakness, dizziness or syncope in the past. Presents to Urgent Care with dizziness. Was also experiencing N&V Sent to ED 07/05/20 EKG showed sinus bradycardia, rate 48 bpm Head CT, head/neck CTA, and brain MRI all unremarkable. COVID test came back positive. Observation status for symptomatic bradycardia, dizziness and anxiety  Clinical Impression  PTA pt living with wife and 3 kids in multistory home with master bed/bath on main floor. Pt completely independent working out of home. Pt is currently limited in safe mobility by dizziness with movement. Pt has been taking meclazine since admission so vestibular screen limited in symptom reproduction, however pt does note slight dizziness with finger follow. Pt is currently independent in mobility. Pt has no immediate PT needs at discharge, however if symptoms remain after COVID quarantine, recommend pt follow up with GP for Vestibular PT evaluation. PT will follow back for more thorough Vestibular exam if pt has not discharged by tomorrow.      Follow Up Recommendations No PT follow up;Other (comment) (Outpatient vestibular PT if symptoms remain after COVID quarantine)    Equipment Recommendations  None recommended by PT       Precautions / Restrictions Precautions Precautions: None Restrictions Weight Bearing Restrictions: No      Mobility  Bed Mobility Overal bed mobility: Independent                  Transfers Overall transfer level: Independent                  Ambulation/Gait Ambulation/Gait assistance: Independent Gait Distance  (Feet): 60 Feet Assistive device: None Gait Pattern/deviations: Step-through pattern;WFL(Within Functional Limits) Gait velocity: WFL Gait velocity interpretation: 1.31 - 2.62 ft/sec, indicative of limited community ambulator General Gait Details: possible increase in HR to 120s however very noisy signal        Balance Overall balance assessment: Modified Independent (difficult to assess high level balance due to in room assessment)                                           Pertinent Vitals/Pain Pain Assessment: No/denies pain    Home Living Family/patient expects to be discharged to:: Private residence Living Arrangements: Spouse/significant other;Children Available Help at Discharge: Family;Available 24 hours/day Type of Home: House Home Access: Level entry     Home Layout: Multi-level;Able to live on main level with bedroom/bathroom Home Equipment: Dan Humphreys - 2 wheels      Prior Function Level of Independence: Independent         Comments: new job, somewhat stressful, assists with 3 kids at home and 1 on the way     Hand Dominance   Dominant Hand: Right    Extremity/Trunk Assessment   Upper Extremity Assessment Upper Extremity Assessment: Overall WFL for tasks assessed    Lower Extremity Assessment Lower Extremity Assessment: Overall WFL for tasks assessed    Cervical / Trunk Assessment Cervical / Trunk Assessment: Normal  Communication  Communication: No difficulties  Cognition Arousal/Alertness: Awake/alert Behavior During Therapy: WFL for tasks assessed/performed Overall Cognitive Status: Within Functional Limits for tasks assessed                                        General Comments General comments (skin integrity, edema, etc.): pt with low level dizziness with postitional change and able to recreate with fast finger follow, no nystagmus noted however pt currently on meclazine, advised pt if continues after  COVID quarantine can follow up with GP for vestibular PT, VSS on RA at rest, increase in HR to 120s however noisy signal        Assessment/Plan    PT Assessment Patient needs continued PT services  PT Problem List Other (comment) (dizziness with movememt)       PT Treatment Interventions Therapeutic exercise    PT Goals (Current goals can be found in the Care Plan section)  Acute Rehab PT Goals Patient Stated Goal: get home PT Goal Formulation: With patient Time For Goal Achievement: 07/21/20 Potential to Achieve Goals: Good    Frequency Min 3X/week    AM-PAC PT "6 Clicks" Mobility  Outcome Measure Help needed turning from your back to your side while in a flat bed without using bedrails?: None Help needed moving from lying on your back to sitting on the side of a flat bed without using bedrails?: None Help needed moving to and from a bed to a chair (including a wheelchair)?: None Help needed standing up from a chair using your arms (e.g., wheelchair or bedside chair)?: None Help needed to walk in hospital room?: None Help needed climbing 3-5 steps with a railing? : None 6 Click Score: 24    End of Session   Activity Tolerance: Patient tolerated treatment well Patient left: in bed;with call bell/phone within reach Nurse Communication: Mobility status PT Visit Diagnosis: Dizziness and giddiness (R42)    Time: 4431-5400 PT Time Calculation (min) (ACUTE ONLY): 33 min   Charges:   PT Evaluation $PT Eval Low Complexity: 1 Low PT Treatments $Therapeutic Activity: 8-22 mins        Hana Trippett B. Beverely Risen PT, DPT Acute Rehabilitation Services Pager [phone] Office [phone]   Elon Alas [facility] 07/07/2020, 9:46 AM

## 2020-07-07 NOTE — Discharge Summary (Signed)
Physician Discharge Summary  Jason Moreno NID:[id number] DOB: Jan 08, 1986 DOA: 07/05/2020  PCP: Pcp, No  Admit date: 07/05/2020 Discharge date: 07/07/2020  Admitted From: Home Disposition: Home  Recommendations for Outpatient Follow-up:  1. Follow up with PCP in 1-2 weeks 2. Please obtain BMP/CBC in one week your next doctors visit.  3. Norvasc 5 mg daily started, Crestor 10 mg daily 4. Cardiology to arrange for outpatient follow-up including ambulatory monitoring and cardiac CT  Discharge Condition: Stable CODE STATUS: Full code Diet recommendation: 2 g salt  Brief/Interim Summary: 35 year old with no known past medical history sent to the ER for complaints of dizziness.  Patient states for the past 2 days prior to admission he has been having some nausea and dizziness for which she went to an urgent care.  He was tested positive for COVID-19, he was noted to be bradycardic with heart rate in 50s with abnormal EKG therefore sent to the hospital.  In the ER patient was seen by cardiology team, underwent routine work-up including echocardiogram which was unremarkable.  He also had CT head and MRI brain which was negative.  He is EKG was Slightly concerning for LVH.  Patient admitted of eating a lot of salt at home.  He was started on Norvasc 5 mg daily and was advised to cut down on his salt intake.  Orthostatics were negative in the hospital and he was hydrated. On the day of discharge I discussed the case with cardiology team who will arrange for outpatient follow-up including cardiac CTA and ambulatory monitoring. Patient feels back to baseline and is stable for discharge.   Dizziness - Resolved.  Combination of some dehydration and COVID related.  Will be going home on meclizine and advised to drink plenty of water.  Therapy is also recommended outpatient follow-up for vestibular therapy once his COVID-19 infection has resolved  COVID-19 infection - Remains asymptomatic  Bradycardia  with abnormal EKG - Echocardiogram shows EF 55% with grade 1 diastolic dysfunction.  Questionable borderline LVH.  Seen by cardiology-we will arrange for outpatient ambulatory monitoring and cardiac CT.  Elevated blood pressure - Secondary to increased salt intake.  Advised to cut down on the salt.  Norvasc 5 mg daily started  Elevated LDL - Crestor 10 mg daily  Body mass index is 29.73 kg/m.         Discharge Diagnoses:  Principal Problem:   Symptomatic bradycardia Active Problems:   Dizziness   Elevated troponin   Abnormal EKG   COVID-19 virus infection   Bradycardia      Consultations:  Cardiology  Subjective: Feels great no complaints  Discharge Exam: Vitals:   07/07/20 0528 07/07/20 0844  BP: 138/87 (!) 142/95  Pulse: (!) 58 (!) 54  Resp: 16 19  Temp: 98.3 F (36.8 C)   SpO2: 100% 100%   Vitals:   07/06/20 1635 07/06/20 2023 07/07/20 0528 07/07/20 0844  BP: (!) 133/98 (!) 142/99 138/87 (!) 142/95  Pulse:  (!) 53 (!) 58 (!) 54  Resp:  16 16 19   Temp:  98.1 F (36.7 C) 98.3 F (36.8 C)   TempSrc:  Oral Oral   SpO2:  100% 100% 100%  Weight:   86.1 kg   Height:        General: Pt is alert, awake, not in acute distress Cardiovascular: RRR, S1/S2 +, no rubs, no gallops Respiratory: CTA bilaterally, no wheezing, no rhonchi Abdominal: Soft, NT, ND, bowel sounds + Extremities: no edema, no cyanosis  Discharge Instructions  Allergies as of 07/07/2020   No Known Allergies     Medication List    TAKE these medications   amLODipine 5 MG tablet Commonly known as: NORVASC Take 1 tablet (5 mg total) by mouth daily for 30 doses.   ibuprofen 200 MG tablet Commonly known as: ADVIL Take 200 mg by mouth every 6 (six) hours as needed for headache or mild pain.   meclizine 25 MG tablet Commonly known as: ANTIVERT Take 1 tablet (25 mg total) by mouth 3 (three) times daily as needed for dizziness. Start taking on: July 09, 2020       No  Known Allergies  You were cared for by a hospitalist during your hospital stay. If you have any questions about your discharge medications or the care you received while you were in the hospital after you are discharged, you can call the unit and asked to speak with the hospitalist on call if the hospitalist that took care of you is not available. Once you are discharged, your primary care physician will handle any further medical issues. Please note that no refills for any discharge medications will be authorized once you are discharged, as it is imperative that you return to your primary care physician (or establish a relationship with a primary care physician if you do not have one) for your aftercare needs so that they can reassess your need for medications and monitor your lab values.   Procedures/Studies: CT Angio Head W or Wo Contrast  Result Date: 07/05/2020 CLINICAL DATA:  Initial evaluation for acute dizziness. EXAM: CT ANGIOGRAPHY HEAD AND NECK TECHNIQUE: Multidetector CT imaging of the head and neck was performed using the standard protocol during bolus administration of intravenous contrast. Multiplanar CT image reconstructions and MIPs were obtained to evaluate the vascular anatomy. Carotid stenosis measurements (when applicable) are obtained utilizing NASCET criteria, using the distal internal carotid diameter as the denominator. CONTRAST:  75mL OMNIPAQUE IOHEXOL 350 MG/ML SOLN COMPARISON:  Prior CT and MRI from earlier same day. FINDINGS: CTA NECK FINDINGS Aortic arch: Visualized aortic arch of normal caliber with normal 3 vessel morphology. No hemodynamically significant stenosis about the origin of the great vessels. Right carotid system: Right common and internal carotid arteries widely patent without stenosis, dissection or occlusion. Left carotid system: Left common and internal carotid arteries widely patent without stenosis, dissection or occlusion. Vertebral arteries: Both vertebral  arteries arise from the subclavian arteries. No proximal subclavian artery stenosis. Left vertebral artery slightly dominant. Vertebral arteries patent without stenosis, dissection or occlusion. Skeleton: No visible acute osseous abnormality. No discrete or worrisome osseous lesions. Other neck: No other acute soft tissue abnormality within the neck. No mass or adenopathy. Mild-to-moderate mucosal thickening noted within the paranasal sinuses, likely inflammatory/allergic in nature. Upper chest: Visualized upper chest demonstrates no acute finding. Review of the MIP images confirms the above findings CTA HEAD FINDINGS Anterior circulation: Both internal carotid arteries widely patent to the termini without stenosis or other abnormality. A1 segments widely patent. Normal anterior communicating artery complex. Anterior cerebral arteries patent to their distal aspects without stenosis. No M1 stenosis or occlusion. Normal MCA bifurcations. Distal MCA branches well perfused and symmetric. Posterior circulation: Both V4 segments widely patent to the vertebrobasilar junction. Both PICA origins patent and normal. Basilar widely patent to its distal aspect. Superior cerebellar arteries patent bilaterally. Both PCA supplied via the basilar as well as small bilateral posterior communicating arteries. PCAs widely patent to their distal aspects. Venous sinuses: Patent. Anatomic variants:  None significant.  No aneurysm. Review of the MIP images confirms the above findings IMPRESSION: Normal CTA of the head and neck. No large vessel occlusion, hemodynamically significant stenosis, or other acute vascular abnormality. Electronically Signed   By: Rise Mu M.D.   On: 07/05/2020 23:14   CT HEAD WO CONTRAST  Result Date: 07/05/2020 CLINICAL DATA:  Vertigo. EXAM: CT HEAD WITHOUT CONTRAST TECHNIQUE: Contiguous axial images were obtained from the base of the skull through the vertex without intravenous contrast.  COMPARISON:  None. FINDINGS: Brain: No acute infarct or intracranial hemorrhage. No mass lesion. No midline shift, ventriculomegaly or extra-axial fluid collection. Vascular: No hyperdense vessel or unexpected calcification. Skull: Negative for fracture or focal lesion. Sinuses/Orbits: No acute finding.  Mild pansinus mucosal thickening. Other: None. IMPRESSION: No acute intracranial process. Electronically Signed   By: Stana Bunting M.D.   On: 07/05/2020 18:01   CT Angio Neck W and/or Wo Contrast  Result Date: 07/05/2020 CLINICAL DATA:  Initial evaluation for acute dizziness. EXAM: CT ANGIOGRAPHY HEAD AND NECK TECHNIQUE: Multidetector CT imaging of the head and neck was performed using the standard protocol during bolus administration of intravenous contrast. Multiplanar CT image reconstructions and MIPs were obtained to evaluate the vascular anatomy. Carotid stenosis measurements (when applicable) are obtained utilizing NASCET criteria, using the distal internal carotid diameter as the denominator. CONTRAST:  67mL OMNIPAQUE IOHEXOL 350 MG/ML SOLN COMPARISON:  Prior CT and MRI from earlier same day. FINDINGS: CTA NECK FINDINGS Aortic arch: Visualized aortic arch of normal caliber with normal 3 vessel morphology. No hemodynamically significant stenosis about the origin of the great vessels. Right carotid system: Right common and internal carotid arteries widely patent without stenosis, dissection or occlusion. Left carotid system: Left common and internal carotid arteries widely patent without stenosis, dissection or occlusion. Vertebral arteries: Both vertebral arteries arise from the subclavian arteries. No proximal subclavian artery stenosis. Left vertebral artery slightly dominant. Vertebral arteries patent without stenosis, dissection or occlusion. Skeleton: No visible acute osseous abnormality. No discrete or worrisome osseous lesions. Other neck: No other acute soft tissue abnormality within the  neck. No mass or adenopathy. Mild-to-moderate mucosal thickening noted within the paranasal sinuses, likely inflammatory/allergic in nature. Upper chest: Visualized upper chest demonstrates no acute finding. Review of the MIP images confirms the above findings CTA HEAD FINDINGS Anterior circulation: Both internal carotid arteries widely patent to the termini without stenosis or other abnormality. A1 segments widely patent. Normal anterior communicating artery complex. Anterior cerebral arteries patent to their distal aspects without stenosis. No M1 stenosis or occlusion. Normal MCA bifurcations. Distal MCA branches well perfused and symmetric. Posterior circulation: Both V4 segments widely patent to the vertebrobasilar junction. Both PICA origins patent and normal. Basilar widely patent to its distal aspect. Superior cerebellar arteries patent bilaterally. Both PCA supplied via the basilar as well as small bilateral posterior communicating arteries. PCAs widely patent to their distal aspects. Venous sinuses: Patent. Anatomic variants: None significant.  No aneurysm. Review of the MIP images confirms the above findings IMPRESSION: Normal CTA of the head and neck. No large vessel occlusion, hemodynamically significant stenosis, or other acute vascular abnormality. Electronically Signed   By: Rise Mu M.D.   On: 07/05/2020 23:14   MR BRAIN WO CONTRAST  Result Date: 07/05/2020 CLINICAL DATA:  Initial evaluation for acute dizziness. EXAM: MRI HEAD WITHOUT CONTRAST TECHNIQUE: Multiplanar, multiecho pulse sequences of the brain and surrounding structures were obtained without intravenous contrast. COMPARISON:  Prior CT from earlier the same day.  FINDINGS: Brain: Cerebral volume within normal limits. No focal parenchymal signal abnormality. No abnormal foci of restricted diffusion to suggest acute or subacute ischemia. Gray-white matter differentiation well maintained. No encephalomalacia to suggest chronic  cortical infarction. No foci of susceptibility artifact to suggest acute or chronic intracranial hemorrhage. No mass lesion, midline shift or mass effect. Ventricles normal size without hydrocephalus. No extra-axial fluid collection. Pituitary gland suprasellar region normal. Midline structures intact and normal. Vascular: Major intracranial vascular flow voids are well maintained and normal. Skull and upper cervical spine: Craniocervical junction within normal limits. Bone marrow signal intensity normal. No scalp soft tissue abnormality. Sinuses/Orbits: Globes and orbital soft tissues within normal limits. Mild to moderate scattered mucosal thickening noted within the sphenoethmoidal and maxillary sinuses. Few superimposed retention cyst noted. No mastoid effusion. Inner ear structures grossly normal. Other: None. IMPRESSION: Normal brain MRI. No acute intracranial abnormality identified. Electronically Signed   By: Rise Mu M.D.   On: 07/05/2020 21:50   DG CHEST PORT 1 VIEW  Result Date: 07/06/2020 CLINICAL DATA:  COVID positive. EXAM: PORTABLE CHEST 1 VIEW COMPARISON:  None. FINDINGS: Normal heart size and mediastinal contours. No acute infiltrate or edema. No effusion or pneumothorax. No acute osseous findings. Artifact from EKG leads. IMPRESSION: No active disease. Electronically Signed   By: Marnee Spring M.D.   On: 07/06/2020 09:35   ECHOCARDIOGRAM COMPLETE  Result Date: 07/06/2020    ECHOCARDIOGRAM REPORT   Patient Name:   Jason Moreno Date of Exam: 07/06/2020 Medical Rec #:  [id number]      Height:       67.0 in Accession #:    [id number]     Weight:       165.0 lb Date of Birth:  1986-06-10       BSA:          1.863 m Patient Age:    34 years       BP:           143/96 mmHg Patient Gender: M              HR:           51 bpm. Exam Location:  Inpatient Procedure: Limited Echo, Cardiac Doppler and Limited Color Doppler Indications:    abnormal ecg  History:        Patient has no prior  history of Echocardiogram examinations.                 Covid, Arrythmias:bradycardia; Signs/Symptoms:elevated troponin.  Sonographer:    Delcie Roch Referring Phys: [id number] ASIA B ZIERLE-GHOSH IMPRESSIONS  1. Left ventricular ejection fraction, by estimation, is 55 to 60%. The left ventricle has normal function. The left ventricle has no regional wall motion abnormalities. There is mild concentric left ventricular hypertrophy. Left ventricular diastolic parameters are consistent with Grade I diastolic dysfunction (impaired relaxation).  2. Right ventricular systolic function is normal. The right ventricular size is normal.  3. The mitral valve is grossly normal. Trivial mitral valve regurgitation.  4. The aortic valve is tricuspid. There is mild thickening of the aortic valve. Aortic valve regurgitation is not visualized.  5. The inferior vena cava is normal in size with greater than 50% respiratory variability, suggesting right atrial pressure of 3 mmHg. FINDINGS  Left Ventricle: Left ventricular ejection fraction, by estimation, is 55 to 60%. The left ventricle has normal function. The left ventricle has no regional wall motion abnormalities. The left ventricular internal cavity size was normal in  size. There is  mild concentric left ventricular hypertrophy. Left ventricular diastolic parameters are consistent with Grade I diastolic dysfunction (impaired relaxation). Right Ventricle: The right ventricular size is normal. Right vetricular wall thickness was not well visualized. Right ventricular systolic function is normal. Left Atrium: Left atrial size was normal in size. Right Atrium: Right atrial size was normal in size. Pericardium: There is no evidence of pericardial effusion. Mitral Valve: The mitral valve is grossly normal. There is mild thickening of the mitral valve leaflet(s). Mild mitral annular calcification. Trivial mitral valve regurgitation. Tricuspid Valve: The tricuspid valve is normal in  structure. Tricuspid valve regurgitation is trivial. Aortic Valve: The aortic valve is tricuspid. There is mild thickening of the aortic valve. Aortic valve regurgitation is not visualized. Pulmonic Valve: The pulmonic valve was normal in structure. Pulmonic valve regurgitation is trivial. Aorta: The aortic root is normal in size and structure. Venous: The inferior vena cava is normal in size with greater than 50% respiratory variability, suggesting right atrial pressure of 3 mmHg. IAS/Shunts: No atrial level shunt detected by color flow Doppler.  LEFT VENTRICLE PLAX 2D LVIDd:         4.70 cm  Diastology LVIDs:         3.20 cm  LV e' medial:    9.68 cm/s LV PW:         1.20 cm  LV E/e' medial:  7.2 LV IVS:        1.10 cm  LV e' lateral:   13.10 cm/s LVOT diam:     2.10 cm  LV E/e' lateral: 5.3 LVOT Area:     3.46 cm  IVC IVC diam: 1.30 cm LEFT ATRIUM         Index LA diam:    3.50 cm 1.88 cm/m   AORTA Ao Root diam: 2.90 cm MITRAL VALVE MV Area (PHT): 3.06 cm    SHUNTS MV Decel Time: 248 msec    Systemic Diam: 2.10 cm MV E velocity: 69.40 cm/s Laurance Flatten MD Electronically signed by Laurance Flatten MD Signature Date/Time: 07/06/2020/3:12:20 PM    Final       The results of significant diagnostics from this hospitalization (including imaging, microbiology, ancillary and laboratory) are listed below for reference.     Microbiology: Recent Results (from the past 240 hour(s))  SARS CORONAVIRUS 2 (TAT 6-24 HRS) Nasopharyngeal Nasopharyngeal Swab     Status: Abnormal   Collection Time: 07/05/20 11:38 PM   Specimen: Nasopharyngeal Swab  Result Value Ref Range Status   SARS Coronavirus 2 POSITIVE (A) NEGATIVE Final    Comment: (NOTE) SARS-CoV-2 target nucleic acids are DETECTED.  The SARS-CoV-2 RNA is generally detectable in upper and lower respiratory specimens during the acute phase of infection. Positive results are indicative of the presence of SARS-CoV-2 RNA. Clinical correlation with  patient history and other diagnostic information is  necessary to determine patient infection status. Positive results do not rule out bacterial infection or co-infection with other viruses.  The expected result is Negative.  Fact Sheet for Patients: HairSlick.no  Fact Sheet for Healthcare Providers: quierodirigir.com  This test is not yet approved or cleared by the [place] FDA and  has been authorized for detection and/or diagnosis of SARS-CoV-2 by FDA under an Emergency Use Authorization (EUA). This EUA will remain  in effect (meaning this test can be used) for the duration of the COVID-19 declaration under Section 564(b)(1) of the Act, 21 U. S.C. section 360bbb-3(b)(1), unless the authorization is  terminated or revoked sooner.   Performed at [facility], [address] [place], [place] [address]      Labs: BNP (last 3 results) No results for input(s): BNP in the last 8760 hours. Basic Metabolic Panel: Recent Labs  Lab 07/05/20 1617 07/06/20 1100  NA 141 139  K 3.9 4.0  CL 106 108  CO2 22 22  GLUCOSE 124* 109*  BUN 9 11  CREATININE 1.11 1.20  CALCIUM 9.1 9.2  MG  --  2.3  PHOS  --  3.3   Liver Function Tests: Recent Labs  Lab 07/05/20 1617 07/06/20 1100  AST 30 30  ALT 38 37  ALKPHOS 40 42  BILITOT 0.7 0.6  PROT 7.5 7.4  ALBUMIN 4.3 4.1   No results for input(s): LIPASE, AMYLASE in the last 168 hours. No results for input(s): AMMONIA in the last 168 hours. CBC: Recent Labs  Lab 07/05/20 1617 07/06/20 1100  WBC 4.1 3.8*  NEUTROABS 2.8 1.6*  HGB 15.4 14.7  HCT 46.9 45.2  MCV 94.6 93.8  PLT 211 226   Cardiac Enzymes: No results for input(s): CKTOTAL, CKMB, CKMBINDEX, TROPONINI in the last 168 hours. BNP: Invalid input(s): POCBNP CBG: No results for input(s): GLUCAP in the last 168 hours. D-Dimer Recent Labs    07/06/20 1100  DDIMER 0.34   Hgb A1c No results for  input(s): HGBA1C in the last 72 hours. Lipid Profile Recent Labs    07/06/20 0243  CHOL 197  HDL 62  LDLCALC 110*  TRIG 127  CHOLHDL 3.2   Thyroid function studies Recent Labs    07/06/20 1100  TSH 0.944   Anemia work up Recent Labs    07/06/20 1100  FERRITIN 114   Urinalysis No results found for: COLORURINE, APPEARANCEUR, LABSPEC, PHURINE, GLUCOSEU, HGBUR, BILIRUBINUR, KETONESUR, PROTEINUR, UROBILINOGEN, NITRITE, LEUKOCYTESUR Sepsis Labs Invalid input(s): PROCALCITONIN,  WBC,  LACTICIDVEN Microbiology Recent Results (from the past 240 hour(s))  SARS CORONAVIRUS 2 (TAT 6-24 HRS) Nasopharyngeal Nasopharyngeal Swab     Status: Abnormal   Collection Time: 07/05/20 11:38 PM   Specimen: Nasopharyngeal Swab  Result Value Ref Range Status   SARS Coronavirus 2 POSITIVE (A) NEGATIVE Final    Comment: (NOTE) SARS-CoV-2 target nucleic acids are DETECTED.  The SARS-CoV-2 RNA is generally detectable in upper and lower respiratory specimens during the acute phase of infection. Positive results are indicative of the presence of SARS-CoV-2 RNA. Clinical correlation with patient history and other diagnostic information is  necessary to determine patient infection status. Positive results do not rule out bacterial infection or co-infection with other viruses.  The expected result is Negative.  Fact Sheet for Patients: HairSlick.nohttps://www.fda.gov/media/138098/download  Fact Sheet for Healthcare Providers: quierodirigir.comhttps://www.fda.gov/media/138095/download  This test is not yet approved or cleared by the [place] FDA and  has been authorized for detection and/or diagnosis of SARS-CoV-2 by FDA under an Emergency Use Authorization (EUA). This EUA will remain  in effect (meaning this test can be used) for the duration of the COVID-19 declaration under Section 564(b)(1) of the Act, 21 U. S.C. section 360bbb-3(b)(1), unless the authorization is terminated or revoked sooner.   Performed at  [facility], [address] [place], [place] [address]      Time coordinating discharge:  I have spent 35 minutes face to face with the patient and on the ward discussing the patients care, assessment, plan and disposition with other care givers. >50% of the time was devoted counseling the patient  about the risks and benefits of treatment/Discharge disposition and coordinating care.   SIGNED:   Dimple NanasAnkit Chirag Angelina Neece, MD  [facility] 07/07/2020, 11:00 AM   If 7PM-7AM, please contact night-coverage

## 2020-07-08 ENCOUNTER — Encounter: Payer: Self-pay | Admitting: *Deleted

## 2020-07-08 NOTE — Progress Notes (Signed)
Patient ID: Jason Moreno, male   DOB: 06-22-1985, 35 y.o.   MRN: [id number] Patient enrolled for [employer] to ship a 30 day cardiac event monitor to his home. Letter with instructions mailed to patient.

## 2020-07-10 ENCOUNTER — Ambulatory Visit (INDEPENDENT_AMBULATORY_CARE_PROVIDER_SITE_OTHER): Payer: 59

## 2020-07-10 DIAGNOSIS — R42 Dizziness and giddiness: Secondary | ICD-10-CM | POA: Diagnosis not present

## 2020-07-10 DIAGNOSIS — R001 Bradycardia, unspecified: Secondary | ICD-10-CM | POA: Diagnosis not present

## 2020-07-30 ENCOUNTER — Telehealth (HOSPITAL_COMMUNITY): Payer: Self-pay | Admitting: *Deleted

## 2020-07-30 NOTE — Telephone Encounter (Signed)
Attempted to call patient regarding upcoming cardiac CT appointment. °Left message on voicemail with name and callback number ° °Layden Caterino RN Navigator Cardiac Imaging °[facility] °[phone] Office °[phone] Cell ° °

## 2020-08-01 ENCOUNTER — Ambulatory Visit (HOSPITAL_COMMUNITY): Admission: RE | Admit: 2020-08-01 | Payer: 59 | Source: Ambulatory Visit

## 2020-08-21 ENCOUNTER — Encounter: Payer: Self-pay | Admitting: Cardiology

## 2020-08-21 NOTE — Progress Notes (Signed)
This encounter was created in error - please disregard.

## 2022-07-25 IMAGING — MR MR HEAD W/O CM
10 of 11 series · 43 of 48 positions shown · non-contrast
Comparison: Prior CT from earlier the same day.

CLINICAL DATA: Initial evaluation for acute dizziness.

EXAM:
MRI HEAD WITHOUT CONTRAST
TECHNIQUE: Multiplanar, multiecho pulse sequences of the brain and surrounding
structures were obtained without intravenous contrast.

[Series 5: DWI · axial · 3.0mm · 0.88mm/px · z∈[-125,+28]mm · 10 of 104 slices shown (1 of 4)]
[im 1/104]
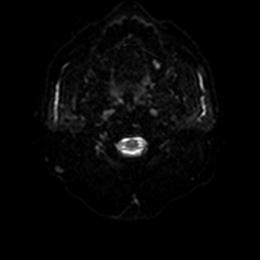
[im 12/104]
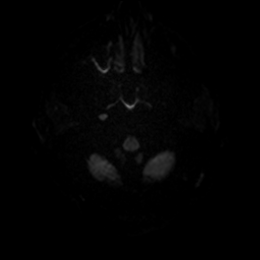
[im 23/104]
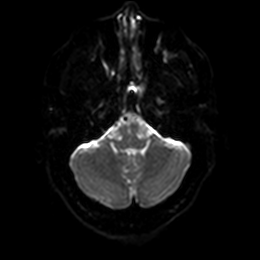
[im 35/104]
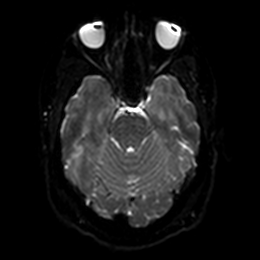
[im 46/104]
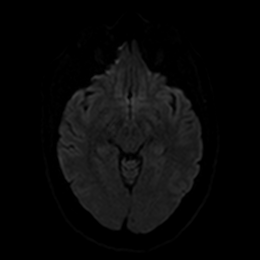
[im 58/104]
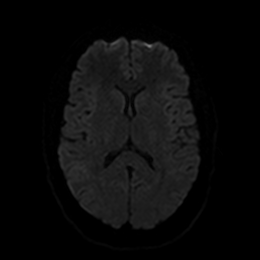
[im 69/104]
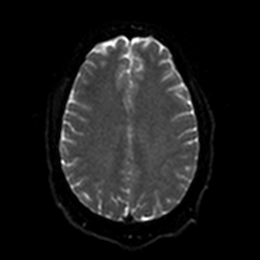
[im 81/104]
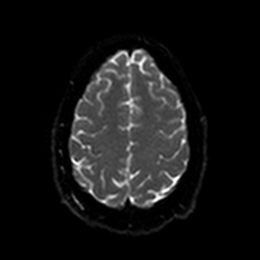
[im 92/104]
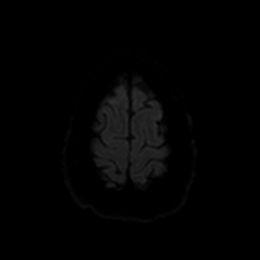
[im 104/104]
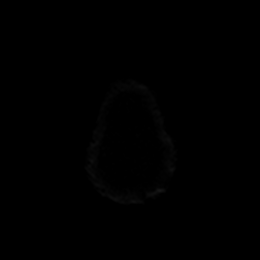

[Series 6: DWI · axial · 3.0mm · 0.88mm/px · z∈[-125,+28]mm · 5 of 52 slices shown (2 of 4)]
[im 1/52]
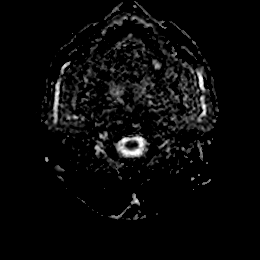
[im 13/52]
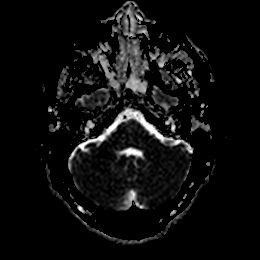
[im 26/52]
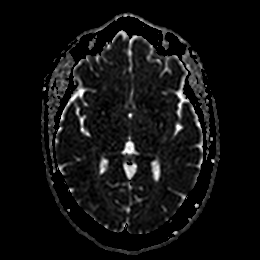
[im 39/52]
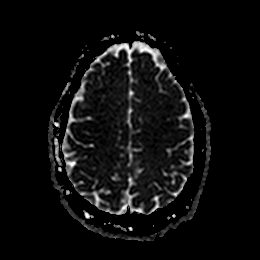
[im 52/52]
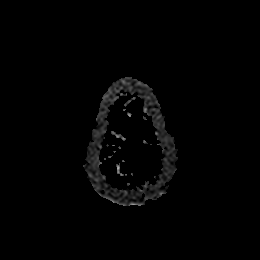

[Series 7: DWI · coronal · 4.0mm · 0.88mm/px · 6 of 72 slices shown (3 of 4)]
[im 1/72]
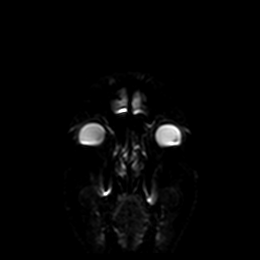
[im 15/72]
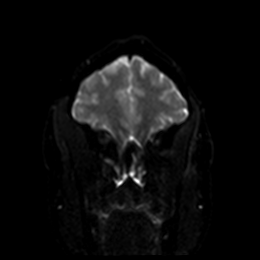
[im 29/72]
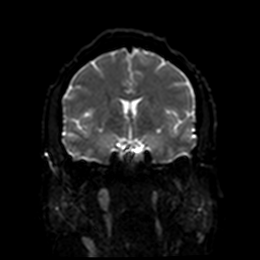
[im 43/72]
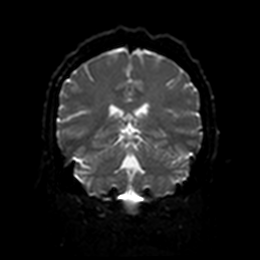
[im 57/72]
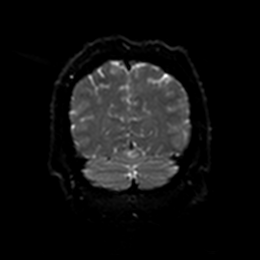
[im 72/72]
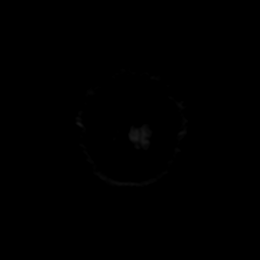

[Series 8: DWI · coronal · 4.0mm · 0.88mm/px · 3 of 36 slices shown (4 of 4)]
[im 1/36]
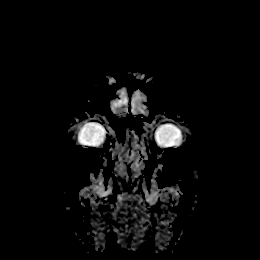
[im 18/36]
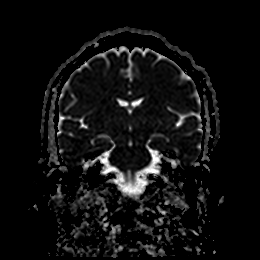
[im 36/36]
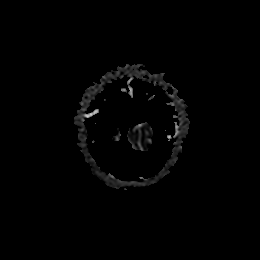

[Series 9: FLAIR · axial · 5.0mm · 0.45mm/px · z∈[-119,+25]mm · 2 of 25 slices shown]
[im 1/25]
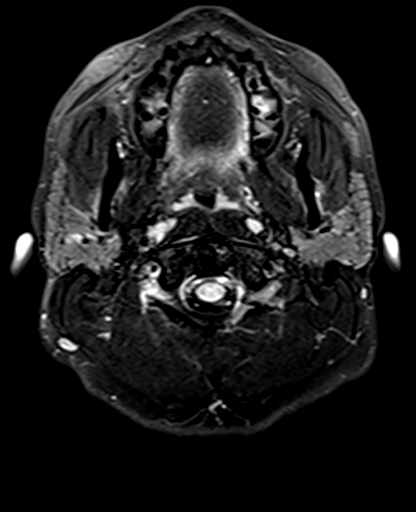
[im 25/25]
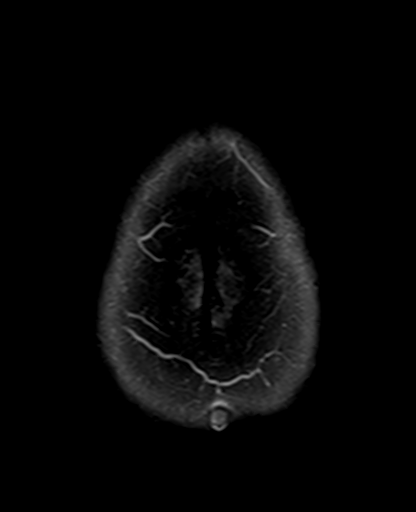

[Series 11: pha_images · axial · 3.0mm · 0.90mm/px · z∈[-122,+39]mm · 5 of 55 slices shown]
[im 1/55]
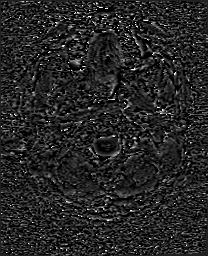
[im 14/55]
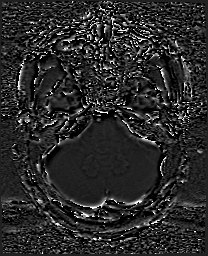
[im 28/55]
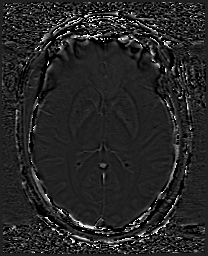
[im 41/55]
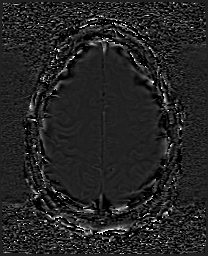
[im 55/55]
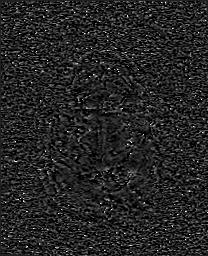

[Series 12: swi_images · axial · 3.0mm · 0.90mm/px · z∈[-125,+39]mm · 5 of 56 slices shown]
[im 1/56]
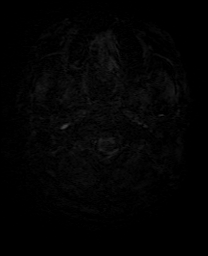
[im 14/56]
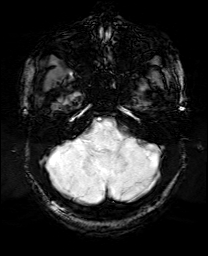
[im 28/56]
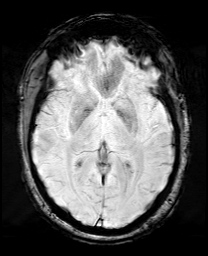
[im 42/56]
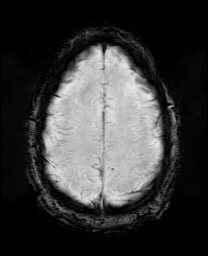
[im 56/56]
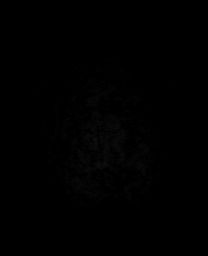

[Series 14: T1 · sagittal · 5.0mm · 0.75mm/px · 2 of 25 slices shown]
[im 1/25]
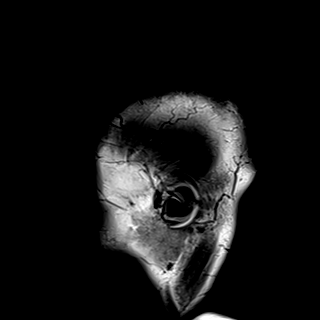
[im 25/25]
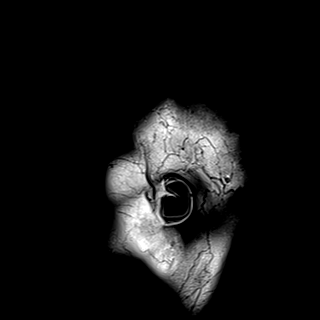

[Series 15: T2 · axial · 5.0mm · 0.72mm/px · z∈[-120,+23]mm · 2 of 25 slices shown (1 of 2)]
[im 1/25]
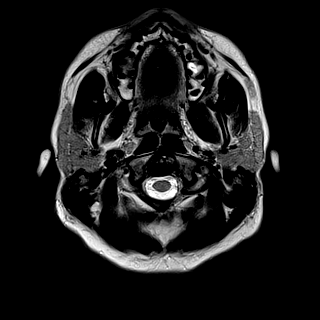
[im 25/25]
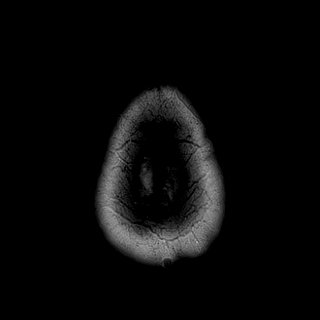

[Series 17: T2 · coronal · 5.0mm · 0.34mm/px · 3 of 30 slices shown (2 of 2)]
[im 1/30]
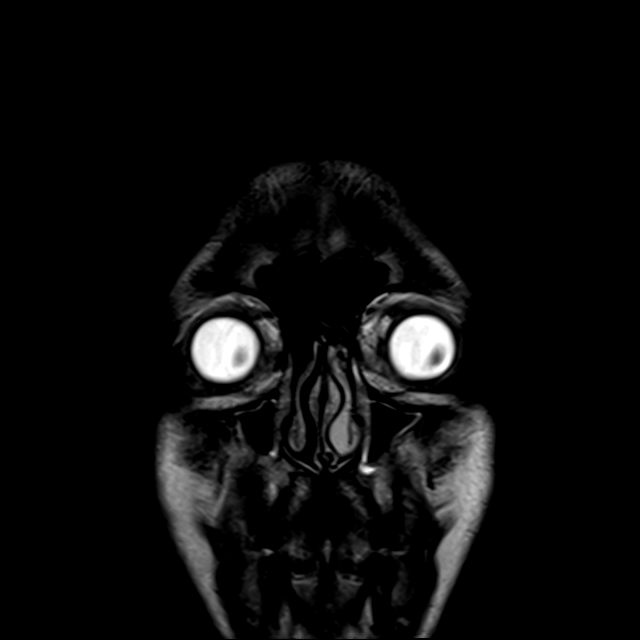
[im 15/30]
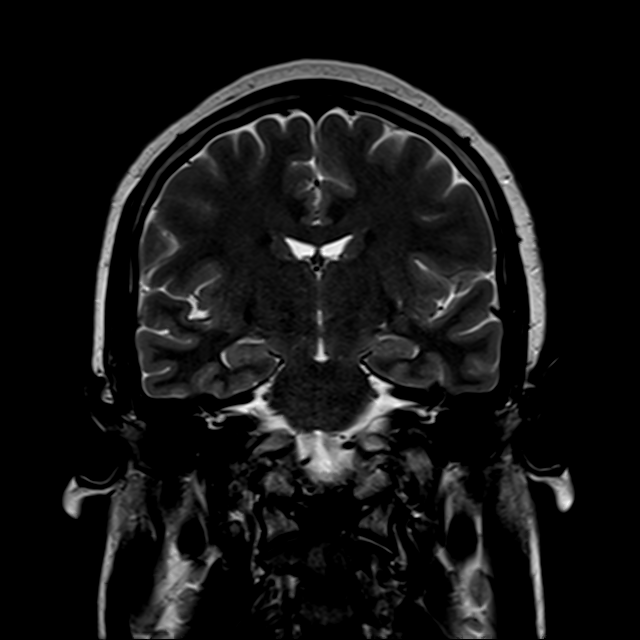
[im 30/30]
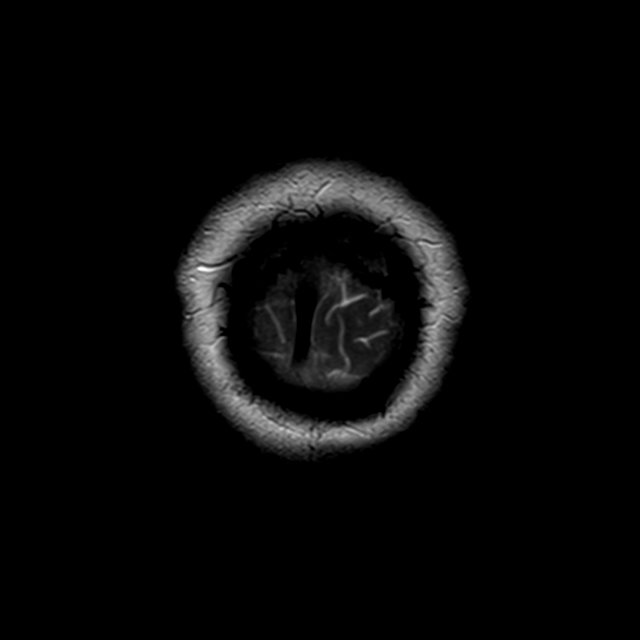

[43 of 48 positions shown; findings below may reference images not displayed]

FINDINGS: Brain: Cerebral volume within normal limits. No focal parenchymal
signal abnormality. No abnormal foci of restricted diffusion to
suggest acute or subacute ischemia. Gray-white matter
differentiation well maintained. No encephalomalacia to suggest
chronic cortical infarction. No foci of susceptibility artifact to
suggest acute or chronic intracranial hemorrhage.

No mass lesion, midline shift or mass effect. Ventricles normal size
without hydrocephalus. No extra-axial fluid collection. Pituitary
gland suprasellar region normal. Midline structures intact and
normal.

Vascular: Major intracranial vascular flow voids are well maintained
and normal.

Skull and upper cervical spine: Craniocervical junction within
normal limits. Bone marrow signal intensity normal. No scalp soft
tissue abnormality.

Sinuses/Orbits: Globes and orbital soft tissues within normal
limits. Mild to moderate scattered mucosal thickening noted within
the sphenoethmoidal and maxillary sinuses. Few superimposed
retention cyst noted. No mastoid effusion. Inner ear structures
grossly normal.

Other: None.
IMPRESSION: Normal brain MRI. No acute intracranial abnormality identified.

## 2023-11-25 ENCOUNTER — Ambulatory Visit: Payer: Self-pay

## 2023-11-25 NOTE — Telephone Encounter (Signed)
 FYI Only or Action Required?: Action required by provider  Patient was last seen in primary care on new patient. Called Nurse Triage reporting Mental Health Problem. Symptoms began several years ago. Interventions attempted: Nothing. Symptoms are: gradually worsening.  Triage Disposition: See PCP Within 2 Weeks   Patient/caregiver understands and will follow disposition?: Yes  Copied from CRM [id number]. Topic: Clinical - Red Word Triage >> Nov 25, 2023  3:55 PM Rosamond Comes wrote: Red Word that prompted transfer to Nurse Triage: patient wife [facility] who is not on DPR  Patient needs referral for ADHD.  Patient  extremely paranoid, over stimulated, high anxiety Reason for Disposition  Behavior problems are chronic or recurrent  Answer Assessment - Initial Assessment Questions 1. REASON FOR CALL: What is your main concern? What is it that you would like to change?     High strung, agitated, ADHD symptoms, alcohol abuse history or DUI, over stimulated 2. THERAPY: Is your husband seeing anyone for this problem? (such as HCP, psychologist, agency)     no 3. SAFETY: Is anyone in danger? Do you feel safe?     Everyone feel safe 4. FAMILY FUNCTIONING: How disruptive is the behavior? Is there anything it keeps you from doing?     Affecting children, children withdrawing 5. PATIENT FUNCTIONING: What does it keep your husband from doing?      Interacting with family, work suffers, unable to complete projects 6. ONSET: When did this problem start?     Ongoing for years, but has been worsening 7. FREQUENCY: How often does the problem happen?     daily 8. CAUSE: What do you think is causing the behavior?     Cultural differences, mental health disorder  Patient already has appointment scheduled of August, 2025, but would like referral for [facility].  Would strongly like to get started as soon as possible Patient's wife also made aware of Behavioral  Health Urgent Care  Protocols used: Psychosocial Problems-P-AH

## 2024-01-27 ENCOUNTER — Ambulatory Visit: Payer: Self-pay | Admitting: Family Medicine

## 2024-01-27 ENCOUNTER — Encounter: Payer: Self-pay | Admitting: Family Medicine

## 2024-01-27 VITALS — BP 141/92 | HR 57 | Temp 98.0°F | Ht 67.0 in | Wt 194.8 lb

## 2024-01-27 DIAGNOSIS — Z6830 Body mass index (BMI) 30.0-30.9, adult: Secondary | ICD-10-CM

## 2024-01-27 DIAGNOSIS — Z0000 Encounter for general adult medical examination without abnormal findings: Secondary | ICD-10-CM | POA: Insufficient documentation

## 2024-01-27 DIAGNOSIS — E785 Hyperlipidemia, unspecified: Secondary | ICD-10-CM | POA: Diagnosis not present

## 2024-01-27 DIAGNOSIS — R030 Elevated blood-pressure reading, without diagnosis of hypertension: Secondary | ICD-10-CM | POA: Diagnosis not present

## 2024-01-27 DIAGNOSIS — Z0001 Encounter for general adult medical examination with abnormal findings: Secondary | ICD-10-CM

## 2024-01-27 DIAGNOSIS — E66811 Obesity, class 1: Secondary | ICD-10-CM

## 2024-01-27 DIAGNOSIS — I10 Essential (primary) hypertension: Secondary | ICD-10-CM | POA: Insufficient documentation

## 2024-01-27 DIAGNOSIS — Z113 Encounter for screening for infections with a predominantly sexual mode of transmission: Secondary | ICD-10-CM

## 2024-01-27 NOTE — Assessment & Plan Note (Signed)
 Labs today. History of CAD in mother's family.

## 2024-01-27 NOTE — Assessment & Plan Note (Signed)
 BP elevated today 141/92. Will monitor at home and bring readings and cuff to OV in 1 week. Labs today. Recommend heart healthy diet such as Mediterranean diet with whole grains, fruits, vegetable, fish, lean meats, nuts, and olive oil. Limit salt. Encouraged moderate walking, 3-5 times/week for 30-50 minutes each session. Aim for at least 150 minutes.week. Goal should be pace of 3 miles/hours, or walking 1.5 miles in 30 minutes. Avoid tobacco products. Avoid excess alcohol. Take medications as prescribed and bring medications and blood pressure log with cuff to each office visit. Seek medical care for chest pain, palpitations, shortness of breath with exertion, dizziness/lightheadedness, vision changes, recurrent headaches, or swelling of extremities.

## 2024-01-27 NOTE — Patient Instructions (Signed)
 It was great to meet you today and I'm excited to have you join the [facility] practice. I hope you had a positive experience today! If you feel so inclined, please feel free to recommend our practice to friends and family. Jeoffrey Barrio, FNP-C

## 2024-01-27 NOTE — Assessment & Plan Note (Signed)
 Counseled on importance of weight management for overall health. Encouraged low calorie, heart healthy diet and moderate intensity exercise 150 minutes weekly. This is 3-5 times weekly for 30-50 minutes each session. Goal should be pace of 3 miles/hours, or walking 1.5 miles in 30 minutes and include strength training. Discussed risks of obesity.

## 2024-01-27 NOTE — Assessment & Plan Note (Signed)

## 2024-01-27 NOTE — Progress Notes (Addendum)
 New Patient Office Visit  Subjective    Patient ID: Jason Moreno, male    DOB: Jan 16, 1986  Age: 38 y.o. MRN: [id number]  CC:  Chief Complaint  Patient presents with   Establish Care    Concerns about current elevated b/p ratings at home     HPI Jason Moreno presents to establish care. Oriented to practice routines and expectations. PMH includes vertigo, HTN. Was seen in ED several years ago for severe dizziness deemed to be vertigo and symptomatic bradycardia. Has cardiac workup and was treated with antihypertensives and anti lipidemics, he took these for 1 month. Previously physcially active playing soccer and his health has declined since becoming more sedentary due to work and picked up poor eating habits. BP at home was 135/95, not checking regularly. Denies chest pain, palpitations, recurrent headaches, vision changes, lightheadedness, dizziness, dyspnea on exertion, or swelling of extremities.  Tobacco: non-smoker STI: consents Vaccines: UTD      Health Maintenance  Topic Date Due   Hepatitis C Screening  Never done   DTaP/Tdap/Td (1 - Tdap) Never done   Hepatitis B Vaccines 19-59 Average Risk (1 of 3 - 19+ 3-dose series) Never done   HPV VACCINES (1 - 3-dose SCDM series) Never done   COVID-19 Vaccine (1 - 2024-25 season) 02/12/2024 (Originally 02/14/2023)   INFLUENZA VACCINE  09/12/2024 (Originally 01/14/2024)   HIV Screening  Completed   Pneumococcal Vaccine  Aged Out   Meningococcal B Vaccine  Aged Out      Outpatient Encounter Medications as of 01/27/2024  Medication Sig   [DISCONTINUED] amLODipine  (NORVASC ) 5 MG tablet Take 1 tablet (5 mg total) by mouth daily for 30 doses. (Patient not taking: Reported on 01/27/2024)   [DISCONTINUED] hydrOXYzine  (VISTARIL ) 100 MG capsule Take 1 capsule (100 mg total) by mouth 3 (three) times daily as needed for anxiety. (Patient not taking: Reported on 01/27/2024)   [DISCONTINUED] ibuprofen  (ADVIL ) 200 MG tablet Take 200 mg by  mouth every 6 (six) hours as needed for headache or mild pain. (Patient not taking: Reported on 01/27/2024)   [DISCONTINUED] meclizine  (ANTIVERT ) 25 MG tablet Take 1 tablet (25 mg total) by mouth 3 (three) times daily as needed for dizziness. (Patient not taking: Reported on 01/27/2024)   [DISCONTINUED] rosuvastatin  (CRESTOR ) 10 MG tablet Take 1 tablet (10 mg total) by mouth daily. (Patient not taking: Reported on 01/27/2024)   No facility-administered encounter medications on file as of 01/27/2024.    Past Medical History:  Diagnosis Date   Abnormal EKG    Bradycardia    COVID-19    Dizziness    Elevated troponin     History reviewed. No pertinent surgical history.  Family History  Problem Relation Age of Onset   Diabetes Father    Allergic Disorder Son     Social History   Socioeconomic History   Marital status: Married    Spouse name: Not on file   Number of children: 4   Years of education: Not on file   Highest education level: Not on file  Occupational History   Not on file  Tobacco Use   Smoking status: Never    Passive exposure: Never   Smokeless tobacco: Never  Vaping Use   Vaping status: Never Used  Substance and Sexual Activity   Alcohol use: Yes    Alcohol/week: 1.0 - 3.0 standard drink of alcohol    Types: 1 - 3 Glasses of wine per week    Comment: occ  Drug use: Not Currently    Types: Marijuana    Comment: occ   Sexual activity: Yes    Birth control/protection: Surgical    Comment: wife has had tubes tied  Other Topics Concern   Not on file  Social History Narrative   Not on file   Social Drivers of Health   Financial Resource Strain: Not on file  Food Insecurity: Not on file  Transportation Needs: Not on file  Physical Activity: Not on file  Stress: Not on file  Social Connections: Not on file  Intimate Partner Violence: Not on file    Review of Systems  Constitutional: Negative.   HENT: Negative.    Eyes: Negative.   Respiratory:  Negative.    Cardiovascular: Negative.   Gastrointestinal: Negative.   Genitourinary: Negative.   Musculoskeletal: Negative.   Skin: Negative.   Neurological: Negative.   Endo/Heme/Allergies: Negative.   Psychiatric/Behavioral: Negative.    All other systems reviewed and are negative.       Objective    BP (!) 141/92   Pulse (!) 57   Temp 98 F (36.7 C)   Ht 5' 7 (1.702 m)   Wt 194 lb 12.8 oz (88.4 kg)   SpO2 98%   BMI 30.51 kg/m   Physical Exam Vitals and nursing note reviewed.  Constitutional:      Appearance: Normal appearance. He is normal weight.  HENT:     Head: Normocephalic and atraumatic.     Right Ear: Tympanic membrane, ear canal and external ear normal.     Left Ear: Tympanic membrane, ear canal and external ear normal.     Nose: Nose normal.     Mouth/Throat:     Mouth: Mucous membranes are moist.     Pharynx: Oropharynx is clear.  Eyes:     Extraocular Movements: Extraocular movements intact.     Right eye: Normal extraocular motion and no nystagmus.     Left eye: Normal extraocular motion and no nystagmus.     Conjunctiva/sclera: Conjunctivae normal.     Pupils: Pupils are equal, round, and reactive to light.  Cardiovascular:     Rate and Rhythm: Normal rate and regular rhythm.     Pulses: Normal pulses.     Heart sounds: Normal heart sounds.  Pulmonary:     Effort: Pulmonary effort is normal.     Breath sounds: Normal breath sounds.  Abdominal:     General: Bowel sounds are normal.     Palpations: Abdomen is soft.  Genitourinary:    Comments: Deferred using shared decision making Musculoskeletal:        General: Normal range of motion.     Cervical back: Normal range of motion and neck supple.  Skin:    General: Skin is warm and dry.     Capillary Refill: Capillary refill takes less than 2 seconds.  Neurological:     General: No focal deficit present.     Mental Status: He is alert. Mental status is at baseline.  Psychiatric:         Mood and Affect: Mood normal.        Speech: Speech normal.        Behavior: Behavior normal.        Thought Content: Thought content normal.        Cognition and Memory: Cognition and memory normal.        Judgment: Judgment normal.         Assessment & Plan:  Problem List Items Addressed This Visit     Physical exam, annual - Primary   Today your medical history was reviewed and routine physical exam with labs was performed. Recommend 150 minutes of moderate intensity exercise weekly and consuming a well-balanced diet. Advised to stop smoking if a smoker, avoid smoking if a non-smoker, limit alcohol consumption to 1 drink per day for women and 2 drinks per day for men, and avoid illicit drug use. Counseled on safe sex practices and offered STI testing today. Counseled on the importance of sunscreen use. Counseled in mental health awareness and when to seek medical care. Vaccine maintenance discussed. Appropriate health maintenance items reviewed. Return to office in 1 year for annual physical exam.       Relevant Orders   CBC with Differential/Platelet   Comprehensive metabolic panel with GFR   Lipid panel   TSH   Hemoglobin A1c   Hepatitis C antibody   HIV Antibody (routine testing w rflx)   RPR   C. trachomatis/N. gonorrhoeae RNA   Elevated blood pressure reading in office without diagnosis of hypertension   BP elevated today 141/92. Will monitor at home and bring readings and cuff to OV in 1 week. Labs today. Recommend heart healthy diet such as Mediterranean diet with whole grains, fruits, vegetable, fish, lean meats, nuts, and olive oil. Limit salt. Encouraged moderate walking, 3-5 times/week for 30-50 minutes each session. Aim for at least 150 minutes.week. Goal should be pace of 3 miles/hours, or walking 1.5 miles in 30 minutes. Avoid tobacco products. Avoid excess alcohol. Take medications as prescribed and bring medications and blood pressure log with cuff to each office  visit. Seek medical care for chest pain, palpitations, shortness of breath with exertion, dizziness/lightheadedness, vision changes, recurrent headaches, or swelling of extremities.       Hyperlipidemia   Labs today. History of CAD in mother's family.      Class 1 obesity with serious comorbidity and body mass index (BMI) of 30.0 to 30.9 in adult   Counseled on importance of weight management for overall health. Encouraged low calorie, heart healthy diet and moderate intensity exercise 150 minutes weekly. This is 3-5 times weekly for 30-50 minutes each session. Goal should be pace of 3 miles/hours, or walking 1.5 miles in 30 minutes and include strength training. Discussed risks of obesity.       Other Visit Diagnoses       Routine screening for STI (sexually transmitted infection)       Relevant Orders   Hepatitis C antibody   HIV Antibody (routine testing w rflx)   RPR   C. trachomatis/N. gonorrhoeae RNA       Return in about 1 week (around 02/03/2024) for hypertension.   Jeoffrey GORMAN Barrio, FNP

## 2024-01-28 LAB — CBC WITH DIFFERENTIAL/PLATELET
Absolute Lymphocytes: 1750 {cells}/uL (ref 850–3900)
Absolute Monocytes: 357 {cells}/uL (ref 200–950)
Basophils Absolute: 21 {cells}/uL (ref 0–200)
Basophils Relative: 0.6 %
Eosinophils Absolute: 70 {cells}/uL (ref 15–500)
Eosinophils Relative: 2 %
HCT: 42.4 % (ref 38.5–50.0)
Hemoglobin: 14 g/dL (ref 13.2–17.1)
MCH: 31.1 pg (ref 27.0–33.0)
MCHC: 33 g/dL (ref 32.0–36.0)
MCV: 94.2 fL (ref 80.0–100.0)
MPV: 11.1 fL (ref 7.5–12.5)
Monocytes Relative: 10.2 %
Neutro Abs: 1302 {cells}/uL — ABNORMAL LOW (ref 1500–7800)
Neutrophils Relative %: 37.2 %
Platelets: 254 Thousand/uL (ref 140–400)
RBC: 4.5 Million/uL (ref 4.20–5.80)
RDW: 14 % (ref 11.0–15.0)
Total Lymphocyte: 50 %
WBC: 3.5 Thousand/uL — ABNORMAL LOW (ref 3.8–10.8)

## 2024-01-28 LAB — LIPID PANEL
Cholesterol: 256 mg/dL — ABNORMAL HIGH (ref <200)
HDL: 73 mg/dL (ref >=40)
Non-HDL Cholesterol (Calc): 183 mg/dL — ABNORMAL HIGH (ref <130)
Total CHOL/HDL Ratio: 3.5 (calc) (ref <5.0)
Triglycerides: 401 mg/dL — ABNORMAL HIGH (ref <150)

## 2024-01-28 LAB — C. TRACHOMATIS/N. GONORRHOEAE RNA
C. trachomatis RNA, TMA: NOT DETECTED
N. gonorrhoeae RNA, TMA: NOT DETECTED

## 2024-01-28 LAB — RPR: RPR Ser Ql: NONREACTIVE

## 2024-01-28 LAB — COMPREHENSIVE METABOLIC PANEL WITH GFR
AG Ratio: 1.9 (calc) (ref 1.0–2.5)
ALT: 34 U/L (ref 9–46)
AST: 32 U/L (ref 10–40)
Albumin: 5 g/dL (ref 3.6–5.1)
Alkaline phosphatase (APISO): 44 U/L (ref 36–130)
BUN: 15 mg/dL (ref 7–25)
CO2: 25 mmol/L (ref 20–32)
Calcium: 9.9 mg/dL (ref 8.6–10.3)
Chloride: 102 mmol/L (ref 98–110)
Creat: 1.11 mg/dL (ref 0.60–1.29)
Globulin: 2.7 g/dL (ref 1.9–3.7)
Glucose, Bld: 105 mg/dL — ABNORMAL HIGH (ref 65–99)
Potassium: 4.8 mmol/L (ref 3.5–5.3)
Sodium: 136 mmol/L (ref 135–146)
Total Bilirubin: 0.5 mg/dL (ref 0.2–1.2)
Total Protein: 7.7 g/dL (ref 6.1–8.1)
eGFR: 86 mL/min/1.73m2 (ref >=60)

## 2024-01-28 LAB — HEMOGLOBIN A1C
Hgb A1c MFr Bld: 6.1 % — ABNORMAL HIGH (ref <5.7)
Mean Plasma Glucose: 128 mg/dL
eAG (mmol/L): 7.1 mmol/L

## 2024-01-28 LAB — HEPATITIS C ANTIBODY: Hepatitis C Ab: NONREACTIVE

## 2024-01-28 LAB — HIV ANTIBODY (ROUTINE TESTING W REFLEX): HIV 1&2 Ab, 4th Generation: NONREACTIVE

## 2024-01-28 LAB — TSH: TSH: 1.28 m[IU]/L (ref 0.40–4.50)

## 2024-02-01 ENCOUNTER — Ambulatory Visit: Payer: Self-pay | Admitting: Family Medicine

## 2024-02-01 DIAGNOSIS — D709 Neutropenia, unspecified: Secondary | ICD-10-CM

## 2024-02-03 ENCOUNTER — Telehealth: Payer: Self-pay | Admitting: Family Medicine

## 2024-02-03 ENCOUNTER — Encounter: Payer: Self-pay | Admitting: Family Medicine

## 2024-02-03 ENCOUNTER — Ambulatory Visit: Admitting: Family Medicine

## 2024-02-03 VITALS — BP 148/88 | HR 58 | Temp 98.4°F | Ht 67.0 in | Wt 194.6 lb

## 2024-02-03 DIAGNOSIS — E782 Mixed hyperlipidemia: Secondary | ICD-10-CM | POA: Diagnosis not present

## 2024-02-03 DIAGNOSIS — I10 Essential (primary) hypertension: Secondary | ICD-10-CM

## 2024-02-03 DIAGNOSIS — E781 Pure hyperglyceridemia: Secondary | ICD-10-CM

## 2024-02-03 NOTE — Assessment & Plan Note (Signed)
 Will repeat lipids fasting. His mother did have CAD. He is working very hard on a healthier lifestyle.  I recommend consuming a heart healthy diet such as Mediterranean diet or DASH diet with whole grains, fruits, vegetable, fish, lean meats, nuts, and olive oil. Limit sweets and processed foods. I also encourage moderate intensity exercise 150 minutes weekly. This is 3-5 times weekly for 30-50 minutes each session. Goal should be pace of 3 miles/hours, or walking 1.5 miles in 30 minutes. The 10-year ASCVD risk score (Arnett DK, et al., 2019) is: 3.8%

## 2024-02-03 NOTE — Progress Notes (Signed)
 Subjective:  HPI: Jason Moreno is a 38 y.o. male presenting on 02/03/2024 for Hypertension   Hypertension   Patient is in today for BP follow up. At his CPE last week his BP was elevated 141/92. Home readings have been 174/98, 171/97, 163/92, 157/88, 150/79, 145/89. Denies chest pain, palpitations, recurrent headaches, vision changes, lightheadedness, dizziness, dyspnea on exertion, or swelling of extremities. LDL unable to calculate on labs due to elevated triglycerides. Was not fasting.  He admits today to using a lot of salt when cooking, frying everything, and eating large amounts of carbs. Was not playing soccer previously. Has been working very hard already on diet and exercise and feels motivated for change.    Lipid Panel     Component Value Date/Time   CHOL 256 (H) 01/27/2024 1226   TRIG 401 (H) 01/27/2024 1226   HDL 73 01/27/2024 1226   CHOLHDL 3.5 01/27/2024 1226   VLDL 25 07/06/2020 0243   LDLCALC  01/27/2024 1226     Comment:     . LDL cholesterol not calculated. Triglyceride levels greater than 400 mg/dL invalidate calculated LDL results. . Reference range: <100 . Desirable range <100 mg/dL for primary prevention;   <70 mg/dL for patients with CHD or diabetic patients  with > or = 2 CHD risk factors. SABRA LDL-C is now calculated using the Martin-Hopkins  calculation, which is a validated novel method providing  better accuracy than the Friedewald equation in the  estimation of LDL-C.  Gladis APPLETHWAITE et al. SANDREA. 7986;689(80): 2061-2068  (http://education.[facility].com/faq/FAQ164)     The 10-year ASCVD risk score (Arnett DK, et al., 2019) is: 3.8%   Values used to calculate the score:     Age: 46 years     Clincally relevant sex: Male     Is Non-Hispanic African American: Yes     Diabetic: No     Tobacco smoker: No     Systolic Blood Pressure: 148 mmHg     Is BP treated: No     HDL Cholesterol: 73 mg/dL     Total Cholesterol: 256  mg/dL    Review of Systems  All other systems reviewed and are negative.   Relevant past medical history reviewed and updated as indicated.   Past Medical History:  Diagnosis Date   Abnormal EKG    Bradycardia    COVID-19    Dizziness    Elevated troponin      History reviewed. No pertinent surgical history.  Allergies and medications reviewed and updated.  No current outpatient medications on file.  No Known Allergies  Objective:   BP (!) 148/88   Pulse (!) 58   Temp 98.4 F (36.9 C)   Ht 5' 7 (1.702 m)   Wt 194 lb 9.6 oz (88.3 kg)   SpO2 99%   BMI 30.48 kg/m      02/03/2024   12:05 PM 01/27/2024   11:48 AM 07/07/2020    8:44 AM  Vitals with BMI  Height 5' 7 5' 7   Weight 194 lbs 10 oz 194 lbs 13 oz   BMI 30.47 30.5   Systolic 148 141 857  Diastolic 88 92 95  Pulse 58 57 54     Physical Exam Vitals and nursing note reviewed.  Constitutional:      Appearance: Normal appearance. He is obese.  HENT:     Head: Normocephalic and atraumatic.  Cardiovascular:     Rate and Rhythm: Normal rate and regular rhythm.  Pulses: Normal pulses.     Heart sounds: Normal heart sounds.  Pulmonary:     Effort: Pulmonary effort is normal.     Breath sounds: Normal breath sounds.  Skin:    General: Skin is warm and dry.     Capillary Refill: Capillary refill takes less than 2 seconds.  Neurological:     General: No focal deficit present.     Mental Status: He is alert and oriented to person, place, and time. Mental status is at baseline.  Psychiatric:        Mood and Affect: Mood normal.        Behavior: Behavior normal.        Thought Content: Thought content normal.        Judgment: Judgment normal.     Assessment & Plan:  Hypertriglyceridemia -     Lipid panel; Future  Hypertension, unspecified type Assessment & Plan: BP remains elevated >140/90. He is adamant about trying lifestyle changes first. Discussed risks of elevated BP. I do believe he is  making big changes in his previously very poor diet and sedentary lifestyle. Will continue to monitor BP at home and report to office in 1 month or sooner if sustains >140/90. Recommend heart healthy diet such as Mediterranean diet with whole grains, fruits, vegetable, fish, lean meats, nuts, and olive oil. Limit salt. Encouraged moderate walking, 3-5 times/week for 30-50 minutes each session. Aim for at least 150 minutes.week. Goal should be pace of 3 miles/hours, or walking 1.5 miles in 30 minutes. Avoid tobacco products. Avoid excess alcohol. Take medications as prescribed and bring medications and blood pressure log with cuff to each office visit. Seek medical care for chest pain, palpitations, shortness of breath with exertion, dizziness/lightheadedness, vision changes, recurrent headaches, or swelling of extremities. Follow up in 3 months or sooner   Moderate mixed hyperlipidemia not requiring statin therapy Assessment & Plan: Will repeat lipids fasting. His mother did have CAD. He is working very hard on a healthier lifestyle.  I recommend consuming a heart healthy diet such as Mediterranean diet or DASH diet with whole grains, fruits, vegetable, fish, lean meats, nuts, and olive oil. Limit sweets and processed foods. I also encourage moderate intensity exercise 150 minutes weekly. This is 3-5 times weekly for 30-50 minutes each session. Goal should be pace of 3 miles/hours, or walking 1.5 miles in 30 minutes. The 10-year ASCVD risk score (Arnett DK, et al., 2019) is: 3.8%       Follow up plan: Return in about 3 months (around 05/05/2024) for chronic follow-up with labs 1 week prior.  Jeoffrey GORMAN Barrio, FNP

## 2024-02-03 NOTE — Assessment & Plan Note (Signed)
 BP remains elevated >140/90. He is adamant about trying lifestyle changes first. Discussed risks of elevated BP. I do believe he is making big changes in his previously very poor diet and sedentary lifestyle. Will continue to monitor BP at home and report to office in 1 month or sooner if sustains >140/90. Recommend heart healthy diet such as Mediterranean diet with whole grains, fruits, vegetable, fish, lean meats, nuts, and olive oil. Limit salt. Encouraged moderate walking, 3-5 times/week for 30-50 minutes each session. Aim for at least 150 minutes.week. Goal should be pace of 3 miles/hours, or walking 1.5 miles in 30 minutes. Avoid tobacco products. Avoid excess alcohol. Take medications as prescribed and bring medications and blood pressure log with cuff to each office visit. Seek medical care for chest pain, palpitations, shortness of breath with exertion, dizziness/lightheadedness, vision changes, recurrent headaches, or swelling of extremities. Follow up in 3 months or sooner

## 2024-02-03 NOTE — Telephone Encounter (Signed)
 Provider wants to see patient in 3 months (around 05/05/2024) for chronic follow-up with labs 1 week prior. Outbound call placed to schedule; Left message to return call.
